# Patient Record
Sex: Female | Born: 1995 | Race: Black or African American | Hispanic: No | Marital: Married | Smoking: Never smoker
Health system: Southern US, Community
[De-identification: ages and names within clinical notes are randomized; demographics above are authoritative.]

## PROBLEM LIST (undated history)

## (undated) ENCOUNTER — Inpatient Hospital Stay (HOSPITAL_COMMUNITY): Payer: Self-pay

## (undated) DIAGNOSIS — F32A Depression, unspecified: Secondary | ICD-10-CM

## (undated) DIAGNOSIS — J45909 Unspecified asthma, uncomplicated: Secondary | ICD-10-CM

## (undated) DIAGNOSIS — K5909 Other constipation: Secondary | ICD-10-CM

## (undated) DIAGNOSIS — R109 Unspecified abdominal pain: Secondary | ICD-10-CM

## (undated) DIAGNOSIS — L309 Dermatitis, unspecified: Secondary | ICD-10-CM

## (undated) HISTORY — DX: Other constipation: K59.09

## (undated) HISTORY — DX: Unspecified abdominal pain: R10.9

---
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
## 2015-01-13 ENCOUNTER — Encounter: Payer: Self-pay | Admitting: Pediatrics

## 2015-01-13 ENCOUNTER — Ambulatory Visit (INDEPENDENT_AMBULATORY_CARE_PROVIDER_SITE_OTHER): Payer: Medicaid Other | Admitting: Pediatrics

## 2015-01-13 VITALS — BP 124/70 | Temp 97.8°F | Wt 161.4 lb

## 2015-01-13 DIAGNOSIS — J301 Allergic rhinitis due to pollen: Secondary | ICD-10-CM

## 2015-01-13 DIAGNOSIS — Z23 Encounter for immunization: Secondary | ICD-10-CM

## 2015-01-13 MED ORDER — FLUTICASONE PROPIONATE 50 MCG/ACT NA SUSP: NASAL | Status: DC

## 2015-01-13 NOTE — Progress Notes (Signed)
History was provided by the patient.  Tina Dickson is a 19 y.o. female who is here for cold like symptoms over the past 3 days.  Congested, cough, headache and feeling warmer. [facility] medicine for her throat but it hasn't been working, has been taking Robitussin at night which helps some.  Symptoms worse at night. No decrease PO intake, change in stools or voids.    The following portions of the patient's history were reviewed and updated as appropriate: allergies, current medications, past family history, past medical history, past social history, past surgical history and problem list.  ROS   Physical Exam:  BP 124/70 mmHg  Temp(Src) 97.8 F (36.6 C) (Temporal)  Wt 161 lb 6.4 oz (73.211 kg)  HR: 70   No height on file for this encounter. No LMP recorded.  General:   alert, cooperative, appears stated age and no distress     Skin:   normal  Oral cavity:   lips, mucosa, and tongue normal; teeth and gums normal, throat is erythematous and noted some post nasal dripping, tenderness over the maximal sinus   Eyes:   sclerae white  Ears:   normal bilaterally  Nose:  no nasal flaring, Nasal turbinates are +4 and boggy   Neck:  Neck appearance: Normal  Lungs:  clear to auscultation bilaterally  Heart:   regular rate and rhythm, S1, S2 normal, no murmur, click, rub or gallop   Abdomen:  soft, non-tender; bowel sounds normal; no masses,  no organomegaly  GU:  not examined  Extremities:   extremities normal, atraumatic, no cyanosis or edema  Neuro:  normal without focal findings    Assessment/Plan:  1. Need for vaccination - Flu Vaccine QUAD 36+ mos IM  2. Allergic rhinitis due to pollen - fluticasone (FLONASE) 50 MCG/ACT nasal spray; 2 sprays each nostril in each nostril in the morning and night.  Dispense: 16 g; Refill: 12     Tina Everly Griffith CitronNicole Deerica Waszak, MD  01/13/2015

## 2015-01-13 NOTE — Patient Instructions (Signed)
Can take Benadryl( Diphenhydramine) every 8 hours as needed for allergic rhinitis symptoms  Allergic Rhinitis Allergic rhinitis is when the mucous membranes in the nose respond to allergens. Allergens are particles in the air that cause your body to have an allergic reaction. This causes you to release allergic antibodies. Through a chain of events, these eventually cause you to release histamine into the blood stream. Although meant to protect the body, it is this release of histamine that causes your discomfort, such as frequent sneezing, congestion, and an itchy, runny nose.  CAUSES Seasonal allergic rhinitis (hay fever) is caused by pollen allergens that may come from grasses, trees, and weeds. Year-round allergic rhinitis (perennial allergic rhinitis) is caused by allergens such as house dust mites, pet dander, and mold spores. SYMPTOMS  Nasal stuffiness (congestion).  Itchy, runny nose with sneezing and tearing of the eyes. DIAGNOSIS Your health care provider can help you determine the allergen or allergens that trigger your symptoms. If you and your health care provider are unable to determine the allergen, skin or blood testing may be used. Your health care provider will diagnose your condition after taking your health history and performing a physical exam. Your health care provider may assess you for other related conditions, such as asthma, pink eye, or an ear infection. TREATMENT Allergic rhinitis does not have a cure, but it can be controlled by:  Medicines that block allergy symptoms. These may include allergy shots, nasal sprays, and oral antihistamines.  Avoiding the allergen. Hay fever may often be treated with antihistamines in pill or nasal spray forms. Antihistamines block the effects of histamine. There are over-the-counter medicines that may help with nasal congestion and swelling around the eyes. Check with your health care provider before taking or giving this medicine. If  avoiding the allergen or the medicine prescribed do not work, there are many new medicines your health care provider can prescribe. Stronger medicine may be used if initial measures are ineffective. Desensitizing injections can be used if medicine and avoidance does not work. Desensitization is when a patient is given ongoing shots until the body becomes less sensitive to the allergen. Make sure you follow up with your health care provider if problems continue. HOME CARE INSTRUCTIONS It is not possible to completely avoid allergens, but you can reduce your symptoms by taking steps to limit your exposure to them. It helps to know exactly what you are allergic to so that you can avoid your specific triggers. SEEK MEDICAL CARE IF:  You have a fever.  You develop a cough that does not stop easily (persistent).  You have shortness of breath.  You start wheezing.  Symptoms interfere with normal daily activities.   This information is not intended to replace advice given to you by your health care provider. Make sure you discuss any questions you have with your health care provider.   Document Released: 12/13/2000 Document Revised: 04/10/2014 Document Reviewed: 11/25/2012 Elsevier Interactive Patient Education [employer]2016 Elsevier Inc.

## 2015-01-22 ENCOUNTER — Encounter (HOSPITAL_COMMUNITY): Payer: Self-pay | Admitting: *Deleted

## 2015-01-22 ENCOUNTER — Emergency Department (HOSPITAL_COMMUNITY)
Admission: EM | Admit: 2015-01-22 | Discharge: 2015-01-22 | Disposition: A | Discharge status: Home / Self Care | Payer: Medicaid Other | Attending: Emergency Medicine | Admitting: Emergency Medicine

## 2015-01-22 DIAGNOSIS — Y998 Other external cause status: Secondary | ICD-10-CM | POA: Diagnosis not present

## 2015-01-22 DIAGNOSIS — Y9289 Other specified places as the place of occurrence of the external cause: Secondary | ICD-10-CM | POA: Diagnosis not present

## 2015-01-22 DIAGNOSIS — T63481A Toxic effect of venom of other arthropod, accidental (unintentional), initial encounter: Secondary | ICD-10-CM | POA: Insufficient documentation

## 2015-01-22 DIAGNOSIS — Z79899 Other long term (current) drug therapy: Secondary | ICD-10-CM | POA: Diagnosis not present

## 2015-01-22 DIAGNOSIS — J45909 Unspecified asthma, uncomplicated: Secondary | ICD-10-CM | POA: Insufficient documentation

## 2015-01-22 DIAGNOSIS — Y9389 Activity, other specified: Secondary | ICD-10-CM | POA: Diagnosis not present

## 2015-01-22 DIAGNOSIS — Z8719 Personal history of other diseases of the digestive system: Secondary | ICD-10-CM | POA: Insufficient documentation

## 2015-01-22 MED ORDER — HYDROCORTISONE 2.5 % EX LOTN: TOPICAL_LOTION | Freq: Two times a day (BID) | CUTANEOUS | Status: DC

## 2015-01-22 MED ORDER — CEPHALEXIN 500 MG PO CAPS
500.0000 mg | ORAL_CAPSULE | Freq: Once | ORAL | Status: AC
  Administered 2015-01-22: 500 mg via ORAL
  Filled 2015-01-22: qty 1

## 2015-01-22 MED ORDER — DIPHENHYDRAMINE HCL 25 MG PO CAPS
50.0000 mg | ORAL_CAPSULE | Freq: Once | ORAL | Status: AC
  Administered 2015-01-22: 50 mg via ORAL
  Filled 2015-01-22: qty 2

## 2015-01-22 MED ORDER — CEPHALEXIN 500 MG PO CAPS: 500.0000 mg | ORAL_CAPSULE | Freq: Three times a day (TID) | ORAL | Status: DC

## 2015-01-22 MED ORDER — HYDROXYZINE HCL 25 MG PO TABS: 25.0000 mg | ORAL_TABLET | Freq: Four times a day (QID) | ORAL | Status: DC | PRN

## 2015-01-22 MED ORDER — IBUPROFEN 800 MG PO TABS
800.0000 mg | ORAL_TABLET | Freq: Once | ORAL | Status: AC
  Administered 2015-01-22: 800 mg via ORAL
  Filled 2015-01-22: qty 1

## 2015-01-22 NOTE — ED Notes (Signed)
Pt states that she was walking to class and felt something bite her to her rt thigh; pt states that yesterday it was the size of a quarter and has progressively gotten bigger; pt has redness to lateral side of thigh; pt c/o itching and pain radiating down rt leg

## 2015-01-22 NOTE — ED Provider Notes (Signed)
CSN: [id number]     Arrival date & time 01/22/15  0112 History   First MD Initiated Contact with Patient 01/22/15 0232     Chief Complaint  Patient presents with  . Insect Bite     (Consider location/radiation/quality/duration/timing/severity/associated sxs/prior Treatment) HPI Comments: 19 year old female presents to the emergency department for further evaluation of an insect bite to her lateral right thigh. Patient states that the bite was sustained 2 days ago. She is unsure of what bit her. Patient reports having 2 areas the size of approximately a quarter which became larger and more erythematous over the last 2 hours. Patient states that the area is itchy. She has taken Benadryl without relief. She complains of some mild aching at the site of her symptoms. She has not taken any medications for pain. Patient denies any pus draining from the area as well as any red streaking up or down her leg. Patient has had no fever, lip swelling, tongue swelling, or SOB.  The history is provided by the patient. No language interpreter was used.    Past Medical History  Diagnosis Date  . Chronic constipation   . Abdominal pain, recurrent     characterized as crampy, spasmodic pain  . Asthma     as a child- not since 2010   Past Surgical History  Procedure Laterality Date  . Hernia repair  1998   Family History  Problem Relation Age of Onset  . Lactose intolerance Mother   . Asthma Brother   . Irritable bowel syndrome Neg Hx   . Diabetes Father   . Cancer Maternal Grandmother   . Diabetes Paternal Grandfather    Social History  Substance Use Topics  . Smoking status: Never Smoker   . Smokeless tobacco: Never Used  . Alcohol Use: No   OB History    No data available      Review of Systems  Constitutional: Negative for fever.  HENT: Negative for facial swelling.   Respiratory: Negative for shortness of breath.   Musculoskeletal: Positive for myalgias.  Skin: Positive for rash.   Neurological: Negative for weakness and numbness.  All other systems reviewed and are negative.   Allergies  Review of patient's allergies indicates no known allergies.  Home Medications   Prior to Admission medications   Medication Sig Start Date End Date Taking? Authorizing Provider  diphenhydrAMINE (BENADRYL) 25 mg capsule Take 25 mg by mouth every 6 (six) hours as needed for itching.   Yes Historical Provider, MD  ibuprofen (ADVIL,MOTRIN) 600 MG tablet Take 1 tablet (600 mg total) by mouth every 6 (six) hours as needed (for menstrual pain). 04/16/14  Yes Gregor Hams, NP  oxymetazoline (AFRIN) 0.05 % nasal spray Place 1 spray into both nostrils 2 (two) times daily.   Yes Historical Provider, MD  cephALEXin (KEFLEX) 500 MG capsule Take 1 capsule (500 mg total) by mouth 3 (three) times daily. 01/22/15   Antony Madura, PA-C  fluticasone (FLONASE) 50 MCG/ACT nasal spray 2 sprays each nostril in each nostril in the morning and night. Patient not taking: Reported on 01/22/2015 01/13/15   Cherece Griffith Citron, MD  hydrocortisone 2.5 % lotion Apply topically 2 (two) times daily. Apply to affected area to improve redness/itching 01/22/15   Antony Madura, PA-C  hydrOXYzine (ATARAX/VISTARIL) 25 MG tablet Take 1 tablet (25 mg total) by mouth every 6 (six) hours as needed for itching. 01/22/15   Antony Madura, PA-C  tretinoin (RETIN-A) 0.025 % cream Apply topically at  bedtime. For acne prevention Patient not taking: Reported on 04/16/2014 04/15/13   Burnard HawthorneMelinda C Paul, MD  Triamcinolone Acetonide (TRIAMCINOLONE 0.1 % CREAM : EUCERIN) CREA Apply 1 application topically 2 (two) times daily. Patient not taking: Reported on 01/22/2015 04/15/13   Burnard HawthorneMelinda C Paul, MD   BP 136/73 mmHg  Pulse 76  Temp(Src) 98.1 F (36.7 C) (Oral)  Resp 20  SpO2 100%  LMP 01/15/2015   Physical Exam  Constitutional: She is oriented to person, place, and time. She appears well-developed and well-nourished. No distress.   Nontoxic/nonseptic appearing  HENT:  Head: Normocephalic and atraumatic.  No angioedema. Patient tolerating secretions without difficulty.  Eyes: Conjunctivae and EOM are normal. No scleral icterus.  Neck: Normal range of motion.  Cardiovascular: Normal rate, regular rhythm and intact distal pulses.   DP and PT pulses 2+ in the RLE  Pulmonary/Chest: Effort normal. No respiratory distress.  Respirations even and unlabored  Musculoskeletal: Normal range of motion.  Neurological: She is alert and oriented to person, place, and time. She exhibits normal muscle tone. Coordination normal.  Skin: Skin is warm and dry. Rash noted. She is not diaphoretic. There is erythema. No pallor.  Large erythematous area to lateral R thigh, approximately 5cm x 15cm. Erythema is pruritic and blanching c/w wheal. No fluctuance or purulence. No red linear streaking.   Psychiatric: She has a normal mood and affect. Her behavior is normal.  Nursing note and vitals reviewed.   ED Course  Procedures (including critical care time) Labs Review Labs Reviewed - No data to display  Imaging Review No results found.   I have personally reviewed and evaluated these images and lab results as part of my medical decision-making.   EKG Interpretation None      MDM   Final diagnoses:  Local reaction to insect sting, accidental or unintentional, initial encounter    19 year old female presents to the emergency department for further evaluation of an insect bite. Symptoms most consistent with a local reaction to an insect sting; however, unable to rule out developing cellulitis given worsening redness over the last few hours. Patient is afebrile and hemodynamically stable. She is neurovascularly intact. No concern for abscess at this time. We'll treat symptomatically with antihistamines as well as Keflex. Return precautions discussed and provided. Patient agreeable to plan with no unaddressed concerns. Patient  discharged in good condition.   Filed Vitals:   01/22/15 0115 01/22/15 0307  BP: 133/77 136/73  Pulse: 62 76  Temp: 98.1 F (36.7 C)   TempSrc: Oral   Resp: 20   SpO2: 96% 100%     Antony MaduraKelly Phillip Maffei, PA-C 01/22/15 0327  Loren Raceravid Yelverton, MD 01/23/15 [phone]

## 2015-01-22 NOTE — Discharge Instructions (Signed)
You are being started on Keflex to prevent any worsening cellulitis that may have occurred as a result of your insect bite. Take Atarax as prescribed for itching. Use hydrocortisone topically for itching and redness. You may take Benadryl as needed for persistent itching, though this may make you drowsy. Take ibuprofen for discomfort/pain. Return to the Emergency Department if you develop fever or red lines streaking up or down your leg, or if you experience any of the symptoms listed below.  Insect Bite Mosquitoes, flies, fleas, bedbugs, and many other insects can bite. Insect bites are different from insect stings. A sting is when poison (venom) is injected into the skin. Insect bites can cause pain or itching for a few days, but they are usually not serious. Some insects can spread diseases to people through a bite. SYMPTOMS  Symptoms of an insect bite include:  Itching or pain in the bite area.  Redness and swelling in the bite area.  An open wound (skin ulcer). In many cases, symptoms last for 2-4 days.  DIAGNOSIS  This condition is usually diagnosed based on symptoms and a physical exam. TREATMENT  Treatment is usually not needed for an insect bite. Symptoms often go away on their own. Your health care provider may recommend creams or lotions to help reduce itching. Antibiotic medicines may be prescribed if the bite becomes infected. A tetanus shot may be given in some cases. If you develop an allergic reaction to an insect bite, your health care provider will prescribe medicines to treat the reaction (antihistamines). This is rare. HOME CARE INSTRUCTIONS  Do not scratch the bite area.  Keep the bite area clean and dry. Wash the bite area daily with soap and water as told by your health care provider.  If directed, applyice to the bite area.  Put ice in a plastic bag.  Place a towel between your skin and the bag.  Leave the ice on for 20 minutes, 2-3 times per day.  To help  reduce itching and swelling, try applying a baking soda paste, cortisone cream, or calamine lotion to the bite area as told by your health care provider.  Apply or take over-the-counter and prescription medicines only as told by your health care provider.  If you were prescribed an antibiotic medicine, use it as told by your health care provider. Do not stop using the antibiotic even if your condition improves.  Keep all follow-up visits as told by your health care provider. This is important. PREVENTION   Use insect repellent. The best insect repellents contain:  DEET, picaridin, oil of lemon eucalyptus (OLE), or IR3535.  Higher amounts of an active ingredient.  When you are outdoors, wear clothing that covers your arms and legs.  Avoid opening windows that do not have window screens. SEEK MEDICAL CARE IF:  You have increased redness, swelling, or pain in the bite area.  You have a fever. SEEK IMMEDIATE MEDICAL CARE IF:   You have joint pain.   You have fluid, blood, or pus coming from the bite area.  You have a headache or neck pain.  You have unusual weakness.  You have a rash.  You have chest pain or shortness of breath.  You have abdominal pain, nausea, or vomiting.  You feel unusually tired or sleepy.   This information is not intended to replace advice given to you by your health care provider. Make sure you discuss any questions you have with your health care provider.   Document  Released: 04/27/2004 Document Revised: 12/09/2014 Document Reviewed: 08/05/2014 Elsevier Interactive Patient Education 2016 Elsevier Inc. Cellulitis Cellulitis is an infection of the skin and the tissue under the skin. The infected area is usually red and tender. This happens most often in the arms and lower legs. HOME CARE   Take your antibiotic medicine as told. Finish the medicine even if you start to feel better.  Keep the infected arm or leg raised (elevated).  Put a warm  cloth on the area up to 4 times per day.  Only take medicines as told by your doctor.  Keep all doctor visits as told. GET HELP IF:  You see red streaks on the skin coming from the infected area.  Your red area gets bigger or turns a dark color.  Your bone or joint under the infected area is painful after the skin heals.  Your infection comes back in the same area or different area.  You have a puffy (swollen) bump in the infected area.  You have new symptoms.  You have a fever. GET HELP RIGHT AWAY IF:   You feel very sleepy.  You throw up (vomit) or have watery poop (diarrhea).  You feel sick and have muscle aches and pains.   This information is not intended to replace advice given to you by your health care provider. Make sure you discuss any questions you have with your health care provider.   Document Released: 09/06/2007 Document Revised: 12/09/2014 Document Reviewed: 06/05/2011 Elsevier Interactive Patient Education [employer] Elsevier Inc.

## 2015-03-02 ENCOUNTER — Ambulatory Visit (INDEPENDENT_AMBULATORY_CARE_PROVIDER_SITE_OTHER): Payer: Medicaid Other | Admitting: Pediatrics

## 2015-03-02 ENCOUNTER — Encounter: Payer: Self-pay | Admitting: Pediatrics

## 2015-03-02 VITALS — Temp 97.9°F | Wt 166.2 lb

## 2015-03-02 DIAGNOSIS — H109 Unspecified conjunctivitis: Secondary | ICD-10-CM | POA: Diagnosis not present

## 2015-03-02 MED ORDER — POLYMYXIN B-TRIMETHOPRIM 10000-0.1 UNIT/ML-% OP SOLN: 1.0000 [drp] | Freq: Three times a day (TID) | OPHTHALMIC | Status: DC

## 2015-03-02 NOTE — Patient Instructions (Signed)
Tina Dickson was seen in clinic today for concern for a red eye-- it is unclear what exactly is causing it, whether she has a virus, bacteria, or a small abrasion that will heal on it's own. We are sending in some antibiotic drops to your pharmacy-- place two drops in her right eye 2-3 times a day for 3-5 days. If it is not improving by the end of the week, come back to clinic so we can continue to monitor. If you have vision changes, severe worsening of pain or discomfort, or any new or worsening symptoms- please call or come back sooner.

## 2015-03-02 NOTE — Progress Notes (Signed)
CC: right eye redness  ASSESSMENT AND PLAN: Tina Dickson is a 19 y.o. female who comes to the clinic for unilateral eye redness/itchiness. Unclear etiology- differential is bacterial vs viral conjunctivitis vs a corneal abrasion vs allergic conjunctivitis. It is difficult to definitively diagnose due to the unilaterality of it, and overall lack of drainage. No evidence of ulcer or abrasion. Vision was assessed- noted to have some deficits, but she did not have her glasses with her, and she did not note any changes from her baseline.   Will treat for presumed bacterial conjunctivitis with poly-trim drops. Plan to see in clinic on Friday if not improving. Counseled on return precautions including severe pain, vision changes or any new symptoms.  Also counseled about discontinuing Afrin use due to possibility of rebound rhinorrea  - polytrim eye drops TID  Return to clinic Friday if symptoms do not improve  SUBJECTIVE Tina Dickson is a 19 y.o. female who comes to the clinic for one day history of red eye. She woke up this morning and her right eye was red. She had a small amount of drainage, and has been playing with it. Described as itchy- not painful. No pain with eye movement, no vision changes, no photosensitivity She wears glasses for distance, but no contacts. Doesn't remember having anything (rocks, eye lash, etc... ) in it.   Has also had a several day history of congestion, sinus pressure, cough. Is taking Afrin for it. Also has a history of allergies- not taking any meds currently.   PMH, Meds, Allergies, Social Hx and pertinent family hx reviewed and updated Past Medical History  Diagnosis Date  . Chronic constipation   . Abdominal pain, recurrent     characterized as crampy, spasmodic pain  . Asthma     as a child- not since 2010    Current outpatient prescriptions:  .  ibuprofen (ADVIL,MOTRIN) 600 MG tablet, Take 1 tablet (600 mg total) by mouth every 6 (six) hours as  needed (for menstrual pain)., Disp: 30 tablet, Rfl: 11 .  fluticasone (FLONASE) 50 MCG/ACT nasal spray, 2 sprays each nostril in each nostril in the morning and night. (Patient not taking: Reported on 01/22/2015), Disp: 16 g, Rfl: 12 .  hydrocortisone 2.5 % lotion, Apply topically 2 (two) times daily. Apply to affected area to improve redness/itching (Patient not taking: Reported on 03/02/2015), Disp: 59 mL, Rfl: 0 .  hydrOXYzine (ATARAX/VISTARIL) 25 MG tablet, Take 1 tablet (25 mg total) by mouth every 6 (six) hours as needed for itching. (Patient not taking: Reported on 03/02/2015), Disp: 12 tablet, Rfl: 0   OBJECTIVE Physical Exam Filed Vitals:   03/02/15 1515  Temp: 97.9 F (36.6 C)  TempSrc: Temporal  Weight: 166 lb 3.2 oz (75.388 kg)   Physical exam:  GEN: Awake, alert in no acute distress, pleasant 19 yo F HEENT: Normocephalic, atraumatic. PERRL. Right eye injected, small amount of drainage at the inner aspect, no pain with light, no pain with eye movement. TM normal bilaterally. Moist mucus membranes. Oropharynx normal with no erythema or exudate. Neck supple. No cervical lymphadenopathy. Mild maxillary sinus tenderness with palpation.   CV: Regular rate and rhythm. No murmurs, rubs or gallops. Normal radial pulses and capillary refill. RESP: Normal work of breathing. Lungs clear to auscultation bilaterally with no wheezes, rales or crackles.  GI: Normal bowel sounds. Abdomen soft, non-tender, non-distended with no hepatosplenomegaly or masses.  SKIN: mild acne on face, no rashes or lesions NEURO: Alert, moves  all extremities normally.   Donetta Potts, MD [facility]

## 2015-03-03 NOTE — Progress Notes (Signed)
I saw and evaluated the patient, performing the key elements of the service. I developed the management plan that is described in the resident's note, and I agree with the content.   Orie RoutAKINTEMI, Sanel Stemmer-KUNLE B                  03/03/2015, 11:59 AM

## 2015-06-02 ENCOUNTER — Other Ambulatory Visit: Payer: Self-pay | Admitting: Pediatrics

## 2015-06-02 DIAGNOSIS — N946 Dysmenorrhea, unspecified: Secondary | ICD-10-CM

## 2015-06-02 MED ORDER — IBUPROFEN 600 MG PO TABS: 600.0000 mg | ORAL_TABLET | Freq: Four times a day (QID) | ORAL | Status: DC | PRN

## 2015-07-03 ENCOUNTER — Inpatient Hospital Stay (HOSPITAL_COMMUNITY)
Admission: AD | Admit: 2015-07-03 | Discharge: 2015-07-03 | Disposition: A | Discharge status: Home / Self Care | Payer: Medicaid Other | Source: Ambulatory Visit | Attending: Obstetrics & Gynecology | Admitting: Obstetrics & Gynecology

## 2015-07-03 ENCOUNTER — Encounter (HOSPITAL_COMMUNITY): Payer: Self-pay

## 2015-07-03 DIAGNOSIS — K5909 Other constipation: Secondary | ICD-10-CM | POA: Diagnosis not present

## 2015-07-03 DIAGNOSIS — N926 Irregular menstruation, unspecified: Secondary | ICD-10-CM | POA: Diagnosis not present

## 2015-07-03 DIAGNOSIS — Z3202 Encounter for pregnancy test, result negative: Secondary | ICD-10-CM

## 2015-07-03 DIAGNOSIS — N912 Amenorrhea, unspecified: Secondary | ICD-10-CM | POA: Diagnosis not present

## 2015-07-03 LAB — URINALYSIS, ROUTINE W REFLEX MICROSCOPIC
Bilirubin Urine: NEGATIVE
GLUCOSE, UA: NEGATIVE mg/dL
Ketones, ur: NEGATIVE mg/dL
LEUKOCYTES UA: NEGATIVE
NITRITE: NEGATIVE
PH: 5.5 (ref 5.0–8.0)
Protein, ur: NEGATIVE mg/dL

## 2015-07-03 LAB — URINE MICROSCOPIC-ADD ON
BACTERIA UA: NONE SEEN
RBC / HPF: NONE SEEN RBC/hpf (ref 0–5)
WBC UA: NONE SEEN WBC/hpf (ref 0–5)

## 2015-07-03 LAB — POCT PREGNANCY, URINE: Preg Test, Ur: NEGATIVE

## 2015-07-03 NOTE — Discharge Instructions (Signed)
Preparing for Pregnancy Before trying to become pregnant, make an appointment with your health care provider (preconception care). The goal is to help you have a healthy, safe pregnancy. At your first appointment, your health care provider will:   Do a complete physical exam, including a Pap test.  Take a complete medical history.  Give you advice and help you resolve any problems. PRECONCEPTION CHECKLIST Here is a list of the basics to cover with your health care provider at your preconception visit:  Medical history.  Tell your health care provider about any diseases you have had. Many diseases can affect your pregnancy.  Include your partner's medical history and family history.  Make sure you have been tested for sexually transmitted infections (STIs). These can affect your pregnancy. In some cases, they can be passed to your baby. Tell your health care provider about any history of STIs.  Make sure your health care provider knows about any previous problems you have had with conception or pregnancy.  Tell your health care provider about any medicine you take. This includes herbal supplements and over-the-counter medicines.  Make sure all your immunizations are up to date. You may need to make additional appointments.  Ask your health care provider if you need any vaccinations or if there are any you should avoid.  Diet.  It is especially important to eat a healthy, balanced diet with the right nutrients when you are pregnant.  Ask your health care provider to help you get to a healthy weight before pregnancy.  If you are overweight, you are at higher risk for certain complications. These include high blood pressure, diabetes, and preterm birth.  If you are underweight, you are more likely to have a low-birth-weight baby.  Lifestyle.  Tell your health care provider about lifestyle factors such as alcohol use, drug use, or smoking.  Describe any harmful substances you may  be exposed to at work or home. These can include chemicals, pesticides, and radiation.  Mental health.  Let your health care provider know if you have been feeling depressed or anxious.  Let your health care provider know if you have a history of substance abuse.  Let your health care provider know if you do not feel safe at home. HOME INSTRUCTIONS TO PREPARE FOR PREGNANCY Follow your health care provider's advice and instructions.   Keep an accurate record of your menstrual periods. This makes it easier for your health care provider to determine your baby's due date.  Begin taking prenatal vitamins and folic acid supplements daily. Take them as directed by your health care provider.  Eat a balanced diet. Get help from a nutrition counselor if you have questions or need help.  Get regular exercise. Try to be active for at least 30 minutes a day most days of the week.  Quit smoking, if you smoke.  Do not drink alcohol.  Do not take illegal drugs.  Get medical problems, such as diabetes or high blood pressure, under control.  If you have diabetes, make sure you do the following:  Have good blood sugar control. If you have type 1 diabetes, use multiple daily doses of insulin. Do not use split-dose or premixed insulin.  Have an eye exam by a qualified eye care professional trained in caring for people with diabetes.  Get evaluated by your health care provider for cardiovascular disease.  Get to a healthy weight. If you are overweight or obese, reduce your weight with the help of a qualified health   professional such as a registered dietitian. Ask your health care provider what the right weight range is for you. HOW DO I KNOW I AM PREGNANT? You may be pregnant if you have been sexually active and you miss your period. Symptoms of early pregnancy include:   Mild cramping.  Very light vaginal bleeding (spotting).  Feeling unusually tired.  Morning sickness. If you have any of  these symptoms, take a home pregnancy test. These tests look for a hormone called human chorionic gonadotropin (hCG) in your urine. Your body begins to make this hormone during early pregnancy. These tests are very accurate. Wait until at least the first day you miss your period to take one. If you get a positive result, call your health care provider to make appointments for prenatal care. WHAT SHOULD I DO IF I BECOME PREGNANT?  Make an appointment with your health care provider by week 12 of your pregnancy at the latest.  Do not smoke. Smoking can be harmful to your baby.  Do not drink alcoholic beverages. Alcohol is related to a number of birth defects.  Avoid toxic odors and chemicals.  You may continue to have sexual intercourse if it does not cause pain or other problems, such as vaginal bleeding.   This information is not intended to replace advice given to you by your health care provider. Make sure you discuss any questions you have with your health care provider.   Document Released: 03/02/2008 Document Revised: 04/10/2014 Document Reviewed: 02/24/2013 Elsevier Interactive Patient Education 2016 Elsevier Inc.  

## 2015-07-03 NOTE — MAU Provider Note (Signed)
History     CSN: [id number]  Arrival date and time: 07/03/15 1011   First Provider Initiated Contact with Patient 07/03/15 1052      Chief Complaint  Patient presents with  . Menstrual Problem  . Vaginal Bleeding   HPI   Ms.Tina Dickson is a 20 y.o. female G0P0000 presenting to MAU with concerns about pregnancy and abnormal menstrual cycles. She had a period in February and it was normal, she missed her period in march and that concerned her.   Desires pregnancy Declines birth control at this time. She is not using any form of hormones.   OB History    Gravida Para Term Preterm AB TAB SAB Ectopic Multiple Living   0 0 0 0 0 0 0 0 0 0       Past Medical History  Diagnosis Date  . Chronic constipation   . Abdominal pain, recurrent     characterized as crampy, spasmodic pain  . Asthma     as a child- not since 2010    Past Surgical History  Procedure Laterality Date  . Hernia repair  1998    Family History  Problem Relation Age of Onset  . Lactose intolerance Mother   . Asthma Brother   . Irritable bowel syndrome Neg Hx   . Diabetes Father   . Cancer Maternal Grandmother   . Diabetes Paternal Grandfather     Social History  Substance Use Topics  . Smoking status: Never Smoker   . Smokeless tobacco: Never Used  . Alcohol Use: No    Allergies: No Known Allergies  Prescriptions prior to admission  Medication Sig Dispense Refill Last Dose  . fluticasone (FLONASE) 50 MCG/ACT nasal spray 2 sprays each nostril in each nostril in the morning and night. (Patient not taking: Reported on 01/22/2015) 16 g 12 Not Taking  . hydrocortisone 2.5 % lotion Apply topically 2 (two) times daily. Apply to affected area to improve redness/itching (Patient not taking: Reported on 03/02/2015) 59 mL 0 Not Taking  . hydrOXYzine (ATARAX/VISTARIL) 25 MG tablet Take 1 tablet (25 mg total) by mouth every 6 (six) hours as needed for itching. (Patient not taking: Reported on 03/02/2015)  12 tablet 0 Not Taking  . ibuprofen (ADVIL,MOTRIN) 600 MG tablet Take 1 tablet (600 mg total) by mouth every 6 (six) hours as needed (for menstrual pain). 30 tablet 11   . trimethoprim-polymyxin b (POLYTRIM) ophthalmic solution Place 1 drop into the right eye 3 (three) times daily. 10 mL 0    Results for orders placed or performed during the hospital encounter of 07/03/15 (from the past 48 hour(s))  Urinalysis, Routine w reflex microscopic (not at [facility])     Status: Abnormal   Collection Time: 07/03/15 10:17 AM  Result Value Ref Range   Color, Urine YELLOW YELLOW   APPearance CLEAR CLEAR   Specific Gravity, Urine <1.005 (L) 1.005 - 1.030   pH 5.5 5.0 - 8.0   Glucose, UA NEGATIVE NEGATIVE mg/dL   Hgb urine dipstick TRACE (A) NEGATIVE   Bilirubin Urine NEGATIVE NEGATIVE   Ketones, ur NEGATIVE NEGATIVE mg/dL   Protein, ur NEGATIVE NEGATIVE mg/dL   Nitrite NEGATIVE NEGATIVE   Leukocytes, UA NEGATIVE NEGATIVE  Urine microscopic-add on     Status: Abnormal   Collection Time: 07/03/15 10:17 AM  Result Value Ref Range   Squamous Epithelial / LPF 0-5 (A) NONE SEEN   WBC, UA NONE SEEN 0 - 5 WBC/hpf   RBC / HPF  NONE SEEN 0 - 5 RBC/hpf   Bacteria, UA NONE SEEN NONE SEEN  Pregnancy, urine POC     Status: None   Collection Time: 07/03/15 10:23 AM  Result Value Ref Range   Preg Test, Ur NEGATIVE NEGATIVE    Comment:        THE SENSITIVITY OF THIS METHODOLOGY IS >24 mIU/mL     Review of Systems  Constitutional: Negative for fever and chills.  Gastrointestinal: Negative for nausea, vomiting and abdominal pain.  Genitourinary: Negative for dysuria.  Neurological: Negative for dizziness.   Physical Exam   Blood pressure 152/85, pulse 83, temperature 98.4 F (36.9 C), resp. rate 18, height  (1.626 m), weight 166 lb (75.297 kg), last menstrual period 05/28/2015.  Physical Exam  Constitutional: She is oriented to person, place, and time. She appears well-developed and well-nourished. No  distress.  HENT:  Head: Normocephalic.  Musculoskeletal: Normal range of motion.  Neurological: She is alert and oriented to person, place, and time.  Skin: Skin is warm. She is not diaphoretic.  Psychiatric: Her behavior is normal.    MAU Course  Procedures None  MDM Patient declines pelvic exam or vaginal cultures.   Assessment and Plan   A:  1. Missed period   2. Encounter for pregnancy test with result negative     P:  Discharge home in stable condition Return to MAU for emergencies Patient given the [facility] contact info and told to call if wanting to establish GYN care At home pregnancy tests encouraged.   Duane Lope, NP 07/03/2015 11:42 AM

## 2015-07-03 NOTE — MAU Note (Addendum)
LMP 05/28/15 missed period in March had spotting two days this past week, denies cramping. Has history of irregular periods.

## 2015-07-05 ENCOUNTER — Emergency Department (INDEPENDENT_AMBULATORY_CARE_PROVIDER_SITE_OTHER)
Admission: EM | Admit: 2015-07-05 | Discharge: 2015-07-05 | Disposition: A | Discharge status: Home / Self Care | Payer: Medicaid Other | Source: Home / Self Care | Attending: Family Medicine | Admitting: Family Medicine

## 2015-07-05 ENCOUNTER — Ambulatory Visit: Payer: Medicaid Other | Admitting: Pediatrics

## 2015-07-05 ENCOUNTER — Encounter (HOSPITAL_COMMUNITY): Payer: Self-pay | Admitting: Emergency Medicine

## 2015-07-05 ENCOUNTER — Encounter: Payer: Self-pay | Admitting: Pediatrics

## 2015-07-05 DIAGNOSIS — Z3200 Encounter for pregnancy test, result unknown: Secondary | ICD-10-CM

## 2015-07-05 LAB — HCG, SERUM, QUALITATIVE: Preg, Serum: NEGATIVE

## 2015-07-05 NOTE — ED Notes (Signed)
Patient c/o possible pregnancy. She reports she has not had a menstrual cycle since Feb. She has had a urine pregnancy which was negative. She would like a blood pregnancy test. Patient is in NAD.

## 2015-07-05 NOTE — ED Provider Notes (Signed)
CSN: [id number]     Arrival date & time 07/05/15  1438 History   First MD Initiated Contact with Patient 07/05/15 1652     No chief complaint on file.  (Consider location/radiation/quality/duration/timing/severity/associated sxs/prior Treatment) HPI Pt is here for serum HCG. She was seen at the ER and had an neg urine, and a neg home test, but wants confirmation by blood.  States that her breasts are different and a bit tender.  No birth control Past Medical History  Diagnosis Date  . Chronic constipation   . Abdominal pain, recurrent     characterized as crampy, spasmodic pain  . Asthma     as a child- not since 2010   Past Surgical History  Procedure Laterality Date  . Hernia repair  1998   Family History  Problem Relation Age of Onset  . Lactose intolerance Mother   . Asthma Brother   . Irritable bowel syndrome Neg Hx   . Diabetes Father   . Cancer Maternal Grandmother   . Diabetes Paternal Grandfather    Social History  Substance Use Topics  . Smoking status: Never Smoker   . Smokeless tobacco: Never Used  . Alcohol Use: No   OB History    Gravida Para Term Preterm AB TAB SAB Ectopic Multiple Living   0 0 0 0 0 0 0 0 0 0      Review of Systems Pregnancy test Allergies  Review of patient's allergies indicates no known allergies.  Home Medications   Prior to Admission medications   Medication Sig Start Date End Date Taking? Authorizing Provider  fluticasone (FLONASE) 50 MCG/ACT nasal spray 2 sprays each nostril in each nostril in the morning and night. Patient not taking: Reported on 01/22/2015 01/13/15   Cherece Griffith CitronNicole Grier, MD  hydrocortisone 2.5 % lotion Apply topically 2 (two) times daily. Apply to affected area to improve redness/itching Patient not taking: Reported on 03/02/2015 01/22/15   Antony MaduraKelly Humes, PA-C  hydrOXYzine (ATARAX/VISTARIL) 25 MG tablet Take 1 tablet (25 mg total) by mouth every 6 (six) hours as needed for itching. Patient not taking:  Reported on 03/02/2015 01/22/15   Antony MaduraKelly Humes, PA-C  ibuprofen (ADVIL,MOTRIN) 600 MG tablet Take 1 tablet (600 mg total) by mouth every 6 (six) hours as needed (for menstrual pain). 06/02/15   Gregor HamsJacqueline Tebben, NP  trimethoprim-polymyxin b (POLYTRIM) ophthalmic solution Place 1 drop into the right eye 3 (three) times daily. 03/02/15   Armanda HeritageSara C Sanders, MD   Meds Ordered and Administered this Visit  Medications - No data to display  LMP 05/28/2015 No data found.   Physical Exam NURSES NOTES AND VITAL SIGNS REVIEWED. CONSTITUTIONAL: Well developed, well nourished, no acute distress HEENT: normocephalic, atraumatic EYES: Conjunctiva normal NECK:normal ROM, supple, no adenopathy PULMONARY:No respiratory distress, normal effort MUSCULOSKELETAL: Normal ROM of all extremities,  SKIN: warm and dry without rash PSYCHIATRIC: Mood and affect, behavior are normal  ED Course  Procedures (including critical care time)  Labs Review Labs Reviewed  HCG, SERUM, QUALITATIVE    Imaging Review No results found.   Visual Acuity Review  Right Eye Distance:   Left Eye Distance:   Bilateral Distance:    Right Eye Near:   Left Eye Near:    Bilateral Near:       Serum HCG is negative Pt will be contacted at home with tests results.  Attempted call, message to call back.   MDM   1. Encounter for pregnancy test     Patient  is reassured that there are no issues that require transfer to higher level of care at this time or additional tests. Patient is advised to continue home symptomatic treatment. Patient is advised that if there are new or worsening symptoms to attend the emergency department, contact primary care provider, or return to UC. Instructions of care provided discharged home in stable condition.    THIS NOTE WAS GENERATED USING A VOICE RECOGNITION SOFTWARE PROGRAM. ALL REASONABLE EFFORTS  WERE MADE TO PROOFREAD THIS DOCUMENT FOR ACCURACY.  I have verbally reviewed the  discharge instructions with the patient. A printed AVS was given to the patient.  All questions were answered prior to discharge.      Tharon Aquas, PA 07/05/15 [place]

## 2015-07-05 NOTE — Discharge Instructions (Signed)
Pregnancy Test Information WHAT IS A PREGNANCY TEST? A pregnancy test is used to detect the presence of human chorionic gonadotropin (hCG) in a sample of your urine or blood. hCG is a hormone produced by the cells of the placenta. The placenta is the organ that forms to nourish and support a developing baby. This test requires a sample of either blood or urine. A pregnancy test determines whether you are pregnant or not. HOW ARE PREGNANCY TESTS DONE? Pregnancy tests are done using a home pregnancy test or having a blood or urine test done at your health care provider's office.  Home pregnancy tests require a urine sample.  Most kits use a plastic testing device with a strip of paper that indicates whether there is hCG in your urine.  Follow the test instructions very carefully.  After you urinate on the test stick, markings will appear to let you know whether you are pregnant.  For best results, use your first urine of the morning. That is when the concentration of hCG is highest. Having a blood test to check for pregnancy requires a sample of blood drawn from a vein in your hand or arm. Your health care provider will send your sample to a lab for testing. Results of a pregnancy test will be positive or negative. IS ONE TYPE OF PREGNANCY TEST BETTER THAN ANOTHER? In some cases, a blood test will return a positive result even if a urine test was negative because blood tests are more sensitive. This means blood tests can detect hCG earlier than home pregnancy tests.  HOW ACCURATE ARE HOME PREGNANCY TESTS?  Both types of pregnancy tests are very accurate.  A blood test is about 98% accurate.  When you are far enough along in your pregnancy and when used correctly, home pregnancy tests are equally accurate. CAN ANYTHING INTERFERE WITH HOME PREGNANCY TEST RESULTS?  It is possible for certain conditions to cause an inaccurate test result (false positive or falsenegative).  A false positive is a  positive test result when you are not pregnant. This can happen if you:  Are taking certain medicines, including anticonvulsants or tranquilizers.  Have certain proteins in your blood.  A false negative is a negative test result when you are pregnant. This can happen if you:  Took the test before there was enough hCG to detect. A pregnancy test will not be positive in most women until 3-4 weeks after conception.  Drank a lot of liquid before the test. Diluted urine samples can sometimes give an inaccurate result.  Take certain medicines, such as water pills (diuretics) or some antihistamines. WHAT SHOULD I DO IF I HAVE A POSITIVE PREGNANCY TEST? If you have a positive pregnancy test, schedule an appointment with your health care provider. You might need additional testing to confirm the pregnancy. In the meantime, begin taking a prenatal vitamin, stop smoking, stop drinking alcohol, and do not use street drugs. Talk to your health care provider about how to take care of yourself during your pregnancy. Ask about what to expect from the care you will need throughout pregnancy (prenatal care).   This information is not intended to replace advice given to you by your health care provider. Make sure you discuss any questions you have with your health care provider.   Document Released: 03/23/2003 Document Revised: 04/10/2014 Document Reviewed: 07/15/2013 Elsevier Interactive Patient Education 2016 Elsevier Inc.  Human Chorionic Gonadotropin Test Human chorionic gonadotropin (hCG) is a hormone produced during pregnancy by the cells  that form the placenta. The placenta is the organ that grows inside your womb (uterus) to nourish a developing baby. When you are pregnant, hCG starts to appear in your blood about 11 days after conception. It continues to go up for the first 8-11 weeks of pregnancy.  Your hCG level can be measured with several different types of tests. You may have:  A urine  test.  hCG is eliminated from your body by your kidneys, so a urine test is one way to check for this hormone.  A urine test only shows whether there is hCG in your urine. It does not measure how much.  You may have a urine test to find out whether you are pregnant.  A home pregnancy test detects whether there is hCG in your urine.  A qualitative blood test.  Like the urine test, this blood test only shows whether there is hCG in your blood. It does not measure how much.  You may have this type of blood test to find out whether you are pregnant.  A quantitative blood test.  This type of blood test measures the amount of hCG in your blood.  You may have this type of test to diagnose an abnormal pregnancy or determine whether you are at risk of, or have had, a failed pregnancy (miscarriage). PREPARATION FOR TEST For the urine test:  Limit your fluid intake before the urine test as directed by your health care provider.  Collect the sample the first time you urinate in the morning.  Let your health care provider know if you have blood in your urine. This may interfere with the test result. Some medicines may interfere with the urine and blood tests. Let your health care provider know about all the medicines you are taking. No additional preparation is required for the blood test.  RESULTS It is your responsibility to obtain your test results. Ask the lab or department performing the test when and how you will get your results. Talk to your health care provider if you have any questions about your test results. The results of the hCG urine test and the qualitative hCG blood test are either positive or negative. The results of the quantitative hCG blood test are reported as a number. hCG is measured in international units per liter (IU/L). Meaning of Negative Test Results A negative result on a urine or qualitative blood test could mean that you are not pregnant. It could also mean the  test was done too early to detect hCG. If you still have other signs of pregnancy, the test should be repeated. Meaning of Positive Test Results A positive result on the urine or qualitative blood tests means you are most likely pregnant. Your health care provider may confirm your pregnancy with an imaging study of the inside of your uterus at 5-6 weeks (ultrasound).  Range of Normal Values Ranges for normal values for the quantitative hCG blood test may vary among different labs and hospitals. You should always check with your health care provider after having lab work or other tests done to discuss whether your values are considered within normal limits.   Less than 5 IU/L means it is most likely you are not pregnant.  Greater than 25 IU/L means it is most likely you are pregnant. Meaning of Results Outside Normal Value Ranges If your hCG level on the quantitative test is not what would be expected, you may have the test again. It may also be important for  your health care provider to know whether your hCG level goes up or down over time. Common causes of results outside the normal range include:   Being pregnant with twins (hCG level is higher than expected).  Having an ectopic pregnancy (hCG rises more slowly than expected).  Miscarriage (hCG level falls).  Abnormal growths in the womb (hCG level is higher than expected).   This information is not intended to replace advice given to you by your health care provider. Make sure you discuss any questions you have with your health care provider.   Document Released: 04/21/2004 Document Revised: 04/10/2014 Document Reviewed: 06/24/2013 Elsevier Interactive Patient Education [employer]

## 2016-04-03 NOTE — L&D Delivery Note (Addendum)
Delivery Note Pt labored to complete with urge to push. She pushed well and at 8:44 PM a viable female was delivered via  (Presentation:ROT ;  ).  APGAR: 8,9 , ; weight  pending .   Placenta status: delivered, intact, .  Cord: nucal x 1 3vc  Anesthesia:  Epidural Episiotomy:  none Lacerations:  B/l labial and first degree perineal  Suture Repair: 3.0 vicryl Est. Blood Loss (mL):  350-426ml  Mom to postpartum.  Baby to Couplet care / Skin to Skin.  Delivered by Dr Hinton Rao as I was in the OR  Cathrine Muster 12/28/2016, 9:05 PM

## 2016-05-08 ENCOUNTER — Inpatient Hospital Stay (HOSPITAL_COMMUNITY): Payer: BLUE CROSS/BLUE SHIELD

## 2016-05-08 ENCOUNTER — Encounter (HOSPITAL_COMMUNITY): Payer: Self-pay | Admitting: *Deleted

## 2016-05-08 ENCOUNTER — Inpatient Hospital Stay (HOSPITAL_COMMUNITY)
Admission: AD | Admit: 2016-05-08 | Discharge: 2016-05-08 | Disposition: A | Discharge status: Home / Self Care | Payer: BLUE CROSS/BLUE SHIELD | Source: Ambulatory Visit | Attending: Obstetrics and Gynecology | Admitting: Obstetrics and Gynecology

## 2016-05-08 DIAGNOSIS — O209 Hemorrhage in early pregnancy, unspecified: Secondary | ICD-10-CM | POA: Insufficient documentation

## 2016-05-08 DIAGNOSIS — Z3A01 Less than 8 weeks gestation of pregnancy: Secondary | ICD-10-CM | POA: Diagnosis not present

## 2016-05-08 LAB — CBC
HEMATOCRIT: 37.4 % (ref 36.0–46.0)
HEMOGLOBIN: 13 g/dL (ref 12.0–15.0)
MCH: 27.4 pg (ref 26.0–34.0)
MCHC: 34.8 g/dL (ref 30.0–36.0)
MCV: 78.9 fL (ref 78.0–100.0)
Platelets: 224 10*3/uL (ref 150–400)
RBC: 4.74 MIL/uL (ref 3.87–5.11)
RDW: 13.4 % (ref 11.5–15.5)
WBC: 5.4 10*3/uL (ref 4.0–10.5)

## 2016-05-08 LAB — URINALYSIS, ROUTINE W REFLEX MICROSCOPIC
Bilirubin Urine: NEGATIVE
Glucose, UA: NEGATIVE mg/dL
Ketones, ur: NEGATIVE mg/dL
Nitrite: NEGATIVE
PROTEIN: NEGATIVE mg/dL
Specific Gravity, Urine: 1.025 (ref 1.005–1.030)
pH: 6 (ref 5.0–8.0)

## 2016-05-08 LAB — WET PREP, GENITAL
CLUE CELLS WET PREP: NONE SEEN
Sperm: NONE SEEN
Trich, Wet Prep: NONE SEEN
Yeast Wet Prep HPF POC: NONE SEEN

## 2016-05-08 LAB — POCT PREGNANCY, URINE: PREG TEST UR: POSITIVE — AB

## 2016-05-08 LAB — ABO/RH: ABO/RH(D): A POS

## 2016-05-08 LAB — HCG, QUANTITATIVE, PREGNANCY: HCG, BETA CHAIN, QUANT, S: 30299 m[IU]/mL — AB (ref <5)

## 2016-05-08 NOTE — MAU Note (Signed)
LMP 03/15/16 per patient.

## 2016-05-08 NOTE — Discharge Instructions (Signed)
Vaginal Bleeding During Pregnancy, First Trimester A small amount of bleeding (spotting) from the vagina is common in early pregnancy. Sometimes the bleeding is normal and is not a problem, and sometimes it is a sign of something serious. Be sure to tell your doctor about any bleeding from your vagina right away. Follow these instructions at home:  Watch your condition for any changes.  Follow your doctor's instructions about how active you can be.  If you are on bed rest:  You may need to stay in bed and only get up to use the bathroom.  You may be allowed to do some activities.  If you need help, make plans for someone to help you.  Write down:  The number of pads you use each day.  How often you change pads.  How soaked (saturated) your pads are.  Do not use tampons.  Do not douche.  Do not have sex or orgasms until your doctor says it is okay.  If you pass any tissue from your vagina, save the tissue so you can show it to your doctor.  Only take medicines as told by your doctor.  Do not take aspirin because it can make you bleed.  Keep all follow-up visits as told by your doctor. Contact a doctor if:  You bleed from your vagina.  You have cramps.  You have labor pains.  You have a fever that does not go away after you take medicine. Get help right away if:  You have very bad cramps in your back or belly (abdomen).  You pass large clots or tissue from your vagina.  You bleed more.  You feel light-headed or weak.  You pass out (faint).  You have chills.  You are leaking fluid or have a gush of fluid from your vagina.  You pass out while pooping (having a bowel movement). This information is not intended to replace advice given to you by your health care provider. Make sure you discuss any questions you have with your health care provider. Document Released: 08/04/2013 Document Revised: 08/26/2015 Document Reviewed: 11/25/2012 Elsevier Interactive  Patient Education  2017 [employer]  If you have heavy bleeding or severe abdominal pain call the office or return here. Keep your scheduled follow up appointment in the office.

## 2016-05-08 NOTE — MAU Note (Signed)
Patient presents to mau with c/o of dark red vaginal bleeding that she notices when wipes; not currently wearing a pad or tampon. States the bleeding started on Saturday and she endorses that she feels like it is getting worse. Denies pain at this time. States this is her first pregnancy and [facility] 5 [facility].

## 2016-05-08 NOTE — MAU Provider Note (Signed)
[facility] Provider Note   CSN: [id number] Arrival date & time: 05/08/16  4098     History   Chief Complaint Chief Complaint  Patient presents with  . Vaginal Bleeding    HPI Tina Dickson is a 21 y.o. G1P0000 @ [redacted]w[redacted]d gestation who presents to the MAU with vaginal bleeding (spotting) that started 3 days ago. Last sexual intercourse 2 weeks ago. Patient complains of mild cramping in the lower abdomen.   The history is provided by the patient. No language interpreter was used.  Vaginal Bleeding  This is a new problem. The current episode started in the past 7 days. The problem occurs intermittently. The problem has been unchanged. Associated symptoms include nausea. Pertinent negatives include no chills, congestion, coughing, fever, headaches, rash, sore throat or vomiting. Abdominal pain: cramping. She has tried nothing for the symptoms.    Past Medical History:  Diagnosis Date  . Abdominal pain, recurrent    characterized as crampy, spasmodic pain  . Asthma    as a child- not since 2010  . Chronic constipation     Patient Active Problem List   Diagnosis Date Noted  . Dysmenorrhea in adolescent 04/17/2014  . Vaginal discharge 04/17/2014  . Allergic rhinitis 08/04/2013  . Unspecified vitamin D deficiency 05/07/2013  . 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 Chronic constipation   . ECZEMA 12/24/2006    Past Surgical History:  Procedure Laterality Date  . HERNIA REPAIR  1998    OB History    Gravida Para Term Preterm AB Living   1 0 0 0 0 0   SAB TAB Ectopic Multiple Live Births   0 0 0 0         Home Medications    Prior to Admission medications   Medication Sig Start Date End Date Taking? Authorizing Provider  Prenatal Vit-Min-FA-Fish Oil ([facility] PRENATAL GUMMY PO) Take 1 tablet by mouth 2 (two) times daily. Take 1 tablet in the morning and 1 at night   Yes Historical Provider, MD  fluticasone (FLONASE) 50 MCG/ACT nasal spray 2 sprays each nostril in  each nostril in the morning and night. Patient not taking: Reported on 01/22/2015 01/13/15   Cherece Griffith Citron, MD  hydrocortisone 2.5 % lotion Apply topically 2 (two) times daily. Apply to affected area to improve redness/itching Patient not taking: Reported on 03/02/2015 01/22/15   Antony Madura, PA-C  hydrOXYzine (ATARAX/VISTARIL) 25 MG tablet Take 1 tablet (25 mg total) by mouth every 6 (six) hours as needed for itching. Patient not taking: Reported on 03/02/2015 01/22/15   Antony Madura, PA-C  ibuprofen (ADVIL,MOTRIN) 600 MG tablet Take 1 tablet (600 mg total) by mouth every 6 (six) hours as needed (for menstrual pain). Patient not taking: Reported on 05/08/2016 06/02/15   Gregor Hams, NP  trimethoprim-polymyxin b (POLYTRIM) ophthalmic solution Place 1 drop into the right eye 3 (three) times daily. Patient not taking: Reported on 05/08/2016 03/02/15   Armanda Heritage, MD    Family History Family History  Problem Relation Age of Onset  . Asthma Brother   . Lactose intolerance Mother   . Diabetes Father   . Cancer Maternal Grandmother   . Diabetes Paternal Grandfather   . Irritable bowel syndrome Neg Hx     Social History Social History  Substance Use Topics  . Smoking status: Never Smoker  . Smokeless tobacco: Never Used  . Alcohol use No     Allergies   Patient has no known allergies.  Review of Systems Review of Systems  Constitutional: Negative for chills and fever.  HENT: Negative for congestion, sinus pain and sore throat.   Eyes: Negative for visual disturbance.  Respiratory: Negative for cough and shortness of breath.   Gastrointestinal: Positive for nausea. Negative for vomiting. Abdominal pain: cramping.  Genitourinary: Positive for vaginal bleeding. Negative for dyspareunia, dysuria and frequency.  Musculoskeletal: Negative for back pain.  Skin: Negative for rash.  Neurological: Negative for dizziness and headaches.  Psychiatric/Behavioral: Negative for  confusion.     Physical Exam Updated Vital Signs BP 135/70 (BP Location: Right Arm) Comment: J. Rasch, NP in department and made aware of BP's  Pulse 84   Temp 98.7 F (37.1 C) (Oral)   Resp 18   Wt 202 lb 9.6 oz (91.9 kg)   LMP 03/15/2016   SpO2 100%   BMI 34.78 kg/m   Physical Exam  Constitutional: She is oriented to person, place, and time. She appears well-developed and well-nourished. No distress.  HENT:  Head: Normocephalic.  Eyes: EOM are normal.  Neck: Normal range of motion. Neck supple.  Cardiovascular: Normal rate and regular rhythm.   Pulmonary/Chest: Effort normal and breath sounds normal.  Abdominal: Soft. Bowel sounds are normal. There is no tenderness.  Genitourinary:  Genitourinary Comments: External genitalia without lesions, scant blood vaginal vault, cervix long, closed, no CMT, no adnexal tenderness or mass palpated. Uterus slightly enlarged.   Musculoskeletal: Normal range of motion.  Neurological: She is alert and oriented to person, place, and time. No cranial nerve deficit.  Skin: Skin is warm and dry.  Psychiatric: She has a normal mood and affect. Her behavior is normal.  Nursing note and vitals reviewed.    ED Treatments / Results  Labs (all labs ordered are listed, but only abnormal results are displayed) Labs Reviewed  WET PREP, GENITAL - Abnormal; Notable for the following:       Result Value   WBC, Wet Prep HPF POC MODERATE (*)    All other components within normal limits  URINALYSIS, ROUTINE W REFLEX MICROSCOPIC - Abnormal; Notable for the following:    APPearance HAZY (*)    Hgb urine dipstick MODERATE (*)    Leukocytes, UA TRACE (*)    Bacteria, UA RARE (*)    Squamous Epithelial / LPF 6-30 (*)    All other components within normal limits  HCG, QUANTITATIVE, PREGNANCY - Abnormal; Notable for the following:    hCG, Beta Chain, Quant, S 30,299 (*)    All other components within normal limits  POCT PREGNANCY, URINE - Abnormal;  Notable for the following:    Preg Test, Ur POSITIVE (*)    All other components within normal limits  CBC  RPR  HIV ANTIBODY (ROUTINE TESTING)  ABO/RH  GC/CHLAMYDIA PROBE AMP ([place]) NOT AT [facility]   Radiology [place] Ob Comp Less 14 Wks  Result Date: 05/08/2016 CLINICAL DATA:  Pregnant, vaginal bleeding x3 days EXAM: OBSTETRIC <14 WK US AND TRANSVAGINAL OB US TECHNIQUE: Both transabdominal and transvaginal ultrasound examinations were performed for complete evaluation of the gestation as well as the maternal uterus, adnexal regions, and pelvic cul-de-sac. Transvaginal technique was performed to assess early pregnancy. COMPARISON:  None. FINDINGS: Intrauterine gestational sac: Single Yolk sac:  Visualized. Embryo:  Visualized. Cardiac Activity: Visualized. Heart Rate: 121  bpm CRL:  6.1  mm   6 w   3 d  [place] EDC: 12/29/2016 Subchorionic hemorrhage:  None visualized. Maternal uterus/adnexae: Bilateral ovaries are within normal limits. Trace pelvic fluid. IMPRESSION: Single live intrauterine gestation, with estimated gestational age [redacted] weeks 3 days by crown-rump length. Electronically Signed   By: Charline Bills M.D.   On: 05/08/2016 11:51   US Ob Transvaginal  Result Date: 05/08/2016 CLINICAL DATA:  Pregnant, vaginal bleeding x3 days EXAM: OBSTETRIC <14 WK [place] AND TRANSVAGINAL OB US TECHNIQUE: Both transabdominal and transvaginal ultrasound examinations were performed for complete evaluation of the gestation as well as the maternal uterus, adnexal regions, and pelvic cul-de-sac. Transvaginal technique was performed to assess early pregnancy. COMPARISON:  None. FINDINGS: Intrauterine gestational sac: Single Yolk sac:  Visualized. Embryo:  Visualized. Cardiac Activity: Visualized. Heart Rate: 121  bpm CRL:  6.1  mm   6 w   3 d                  [place] EDC: 12/29/2016 Subchorionic hemorrhage:  None visualized. Maternal uterus/adnexae: Bilateral ovaries are within normal limits. Trace pelvic fluid.  IMPRESSION: Single live intrauterine gestation, with estimated gestational age [redacted] weeks 3 days by crown-rump length. Electronically Signed   By: Charline Bills M.D.   On: 05/08/2016 11:51    Procedures Procedures (including critical care time)  Medications Ordered in ED Medications - No data to display   Initial Impression / Assessment and Plan / ED Course  I have reviewed the triage vital signs and the nursing notes.  Pertinent labs & imaging results that were available during my care of the patient were reviewed by me and considered in my medical decision making (see chart for details).  Consult with Dr. Senaida Ores @ 12:25 pm and she agrees with plan.   Final Clinical Impressions(s) / ED Diagnoses  20 y.o. G1P0000 @ [redacted]w[redacted]d gestation stable for d/c without heavy bleeding and viable IUP on ultrasound. She will keep he f/u appointment in the office. She will return sooner for any problems. Return precautions given.  Final diagnoses:  Bleeding in early pregnancy    New Prescriptions Current Discharge Medication List

## 2016-05-09 LAB — HIV ANTIBODY (ROUTINE TESTING W REFLEX): HIV SCREEN 4TH GENERATION: NONREACTIVE

## 2016-05-09 LAB — GC/CHLAMYDIA PROBE AMP (~~LOC~~) NOT AT ARMC
CHLAMYDIA, DNA PROBE: NEGATIVE
NEISSERIA GONORRHEA: NEGATIVE

## 2016-05-09 LAB — RPR: RPR: NONREACTIVE

## 2016-05-23 LAB — OB RESULTS CONSOLE HEPATITIS B SURFACE ANTIGEN: Hepatitis B Surface Ag: NEGATIVE

## 2016-05-23 LAB — OB RESULTS CONSOLE GC/CHLAMYDIA: Gonorrhea: NEGATIVE

## 2016-05-23 LAB — OB RESULTS CONSOLE RUBELLA ANTIBODY, IGM: Rubella: IMMUNE

## 2016-05-27 ENCOUNTER — Inpatient Hospital Stay (HOSPITAL_COMMUNITY)
Admission: AD | Admit: 2016-05-27 | Discharge: 2016-05-27 | Disposition: A | Discharge status: Home / Self Care | Payer: BLUE CROSS/BLUE SHIELD | Source: Ambulatory Visit | Attending: Obstetrics and Gynecology | Admitting: Obstetrics and Gynecology

## 2016-05-27 ENCOUNTER — Encounter (HOSPITAL_COMMUNITY): Payer: Self-pay | Admitting: *Deleted

## 2016-05-27 DIAGNOSIS — O99611 Diseases of the digestive system complicating pregnancy, first trimester: Secondary | ICD-10-CM | POA: Diagnosis not present

## 2016-05-27 DIAGNOSIS — K5909 Other constipation: Secondary | ICD-10-CM | POA: Diagnosis not present

## 2016-05-27 DIAGNOSIS — R112 Nausea with vomiting, unspecified: Secondary | ICD-10-CM | POA: Insufficient documentation

## 2016-05-27 DIAGNOSIS — O218 Other vomiting complicating pregnancy: Secondary | ICD-10-CM | POA: Insufficient documentation

## 2016-05-27 DIAGNOSIS — O99511 Diseases of the respiratory system complicating pregnancy, first trimester: Secondary | ICD-10-CM | POA: Diagnosis not present

## 2016-05-27 DIAGNOSIS — R109 Unspecified abdominal pain: Secondary | ICD-10-CM | POA: Diagnosis present

## 2016-05-27 DIAGNOSIS — Z3A09 9 weeks gestation of pregnancy: Secondary | ICD-10-CM | POA: Insufficient documentation

## 2016-05-27 DIAGNOSIS — K219 Gastro-esophageal reflux disease without esophagitis: Secondary | ICD-10-CM | POA: Diagnosis not present

## 2016-05-27 DIAGNOSIS — O219 Vomiting of pregnancy, unspecified: Secondary | ICD-10-CM | POA: Diagnosis not present

## 2016-05-27 DIAGNOSIS — R1110 Vomiting, unspecified: Secondary | ICD-10-CM | POA: Diagnosis present

## 2016-05-27 LAB — URINALYSIS, ROUTINE W REFLEX MICROSCOPIC
Bilirubin Urine: NEGATIVE
Glucose, UA: NEGATIVE mg/dL
HGB URINE DIPSTICK: NEGATIVE
Ketones, ur: 5 mg/dL — AB
NITRITE: NEGATIVE
PH: 7 (ref 5.0–8.0)
PROTEIN: 30 mg/dL — AB
SPECIFIC GRAVITY, URINE: 1.026 (ref 1.005–1.030)

## 2016-05-27 MED ORDER — METOCLOPRAMIDE HCL 5 MG/ML IJ SOLN
10.0000 mg | Freq: Once | INTRAMUSCULAR | Status: AC
  Administered 2016-05-27: 10 mg via INTRAMUSCULAR
  Filled 2016-05-27: qty 2

## 2016-05-27 MED ORDER — DOXYLAMINE-PYRIDOXINE 10-10 MG PO TBEC: 10.0000 mg | DELAYED_RELEASE_TABLET | Freq: Two times a day (BID) | ORAL | 2 refills | Status: DC

## 2016-05-27 MED ORDER — FAMOTIDINE 20 MG PO TABS
40.0000 mg | ORAL_TABLET | Freq: Once | ORAL | Status: AC
  Administered 2016-05-27: 40 mg via ORAL
  Filled 2016-05-27: qty 2

## 2016-05-27 MED ORDER — METOCLOPRAMIDE HCL 10 MG PO TABS: 10.0000 mg | ORAL_TABLET | Freq: Three times a day (TID) | ORAL | 1 refills | Status: DC

## 2016-05-27 MED ORDER — METOCLOPRAMIDE HCL 5 MG/ML IJ SOLN: 10.0000 mg | Freq: Once | INTRAMUSCULAR | Status: DC

## 2016-05-27 NOTE — MAU Provider Note (Signed)
History     CSN: [id number]  Arrival date and time: 05/27/16 1115   First Provider Initiated Contact with Patient 05/27/16 1206      Chief Complaint  Patient presents with  . Emesis During Pregnancy  . Abdominal Pain   HPI   Ms.Tina Dickson is a 21 y.o. female G1P0000 @ [redacted]w[redacted]d here in MAU with upper abdominal pain and N/V. She has vomited 2 times in the last 24 hours. She does not have any medication at home for the N/V. She has not eaten anything today. She tried to drink water however she vomited.  She is without vaginal bleeding or lower abdominal pain.   OB History    Gravida Para Term Preterm AB Living   1 0 0 0 0 0   SAB TAB Ectopic Multiple Live Births   0 0 0 0        Past Medical History:  Diagnosis Date  . Abdominal pain, recurrent    characterized as crampy, spasmodic pain  . Asthma    as a child- not since 2010  . Chronic constipation     Past Surgical History:  Procedure Laterality Date  . HERNIA REPAIR  1998    Family History  Problem Relation Age of Onset  . Asthma Brother   . Lactose intolerance Mother   . Diabetes Father   . Cancer Maternal Grandmother   . Diabetes Paternal Grandfather   . Irritable bowel syndrome Neg Hx     Social History  Substance Use Topics  . Smoking status: Never Smoker  . Smokeless tobacco: Never Used  . Alcohol use No    Allergies: No Known Allergies  Prescriptions Prior to Admission  Medication Sig Dispense Refill Last Dose  . Prenatal MV-Min-FA-Omega-3 (PRENATAL GUMMIES/DHA & FA) 0.4-32.5 MG CHEW Chew 1 each by mouth 2 (two) times daily.   05/26/2016 at Unknown time   Results for orders placed or performed during the hospital encounter of 05/27/16 (from the past 48 hour(s))  Urinalysis, Routine w reflex microscopic     Status: Abnormal   Collection Time: 05/27/16 11:23 AM  Result Value Ref Range   Color, Urine YELLOW YELLOW   APPearance CLOUDY (A) CLEAR   Specific Gravity, Urine 1.026 1.005 - 1.030   pH 7.0 5.0 - 8.0   Glucose, UA NEGATIVE NEGATIVE mg/dL   Hgb urine dipstick NEGATIVE NEGATIVE   Bilirubin Urine NEGATIVE NEGATIVE   Ketones, ur 5 (A) NEGATIVE mg/dL   Protein, ur 30 (A) NEGATIVE mg/dL   Nitrite NEGATIVE NEGATIVE   Leukocytes, UA MODERATE (A) NEGATIVE   RBC / HPF 0-5 0 - 5 RBC/hpf   WBC, UA 6-30 0 - 5 WBC/hpf   Bacteria, UA RARE (A) NONE SEEN   Squamous Epithelial / LPF 6-30 (A) NONE SEEN   Mucous PRESENT    Review of Systems  Gastrointestinal: Positive for nausea and vomiting.   Physical Exam   Blood pressure 124/74, pulse 81, temperature 98.6 F (37 C), temperature source Oral, resp. rate 18, height 5\' 4"  (1.626 m), weight 198 lb (89.8 kg), last menstrual period 03/15/2016.  Physical Exam  Constitutional: She is oriented to person, place, and time. She appears well-developed and well-nourished. No distress.  HENT:  Head: Normocephalic.  Respiratory: Effort normal.  GI: Normal appearance. There is tenderness in the epigastric area.  Musculoskeletal: Normal range of motion.  Neurological: She is alert and oriented to person, place, and time.  Skin: Skin is warm. She is  not diaphoretic.  Psychiatric: Her behavior is normal.    MAU Course  Procedures  None  MDM  UA without signs of severe dehydration.  Reglan 10 mg IM Pepcid 40 mg PO  Patient tolerating PO fluids. Pain is gone   Assessment and Plan   A:  1. Nausea and vomiting in pregnancy   2. Gastroesophageal reflux disease, esophagitis presence not specified     P:  Discharge home in stable condition  Rx: Diclegis, Reglan (fill only if Diclegis is to costly) Return to MAU if symptoms worsen Follow up with OB Small, frequent meals   Tina LopeJennifer I Ashayla Subia, NP 05/27/2016 3:21 PM

## 2016-05-27 NOTE — MAU Note (Signed)
Pt has been having nausea for "a while,"  Is eating small meals.  Started vomiting Thursday, had to miss work.  Was some better yesterday, ate last night.  Started vomiting again this morning, feeling weak.  Denies diarrhea.  Having mid & upper abdominal, is worse at night.  No vaginal bleeding.

## 2016-05-27 NOTE — Discharge Instructions (Signed)
Nausea and Vomiting, Adult Introduction Feeling sick to your stomach (nausea) means that your stomach is upset or you feel like you have to throw up (vomit). Feeling more and more sick to your stomach can lead to throwing up. Throwing up happens when food and liquid from your stomach are thrown up and out the mouth. Throwing up can make you feel weak and cause you to get dehydrated. Dehydration can make you tired and thirsty, make you have a dry mouth, and make it so you pee (urinate) less often. Older adults and people with other diseases or a weak defense system (immune system) are at higher risk for dehydration. If you feel sick to your stomach or if you throw up, it is important to follow instructions from your doctor about how to take care of yourself. Follow these instructions at home: Eating and drinking Follow these instructions as told by your doctor:  Take an oral rehydration solution (ORS). This is a drink that is sold at pharmacies and stores.  Drink clear fluids in small amounts as you are able, such as:  Water.  Ice chips.  Diluted fruit juice.  Low-calorie sports drinks.  Eat bland, easy-to-digest foods in small amounts as you are able, such as:  Bananas.  Applesauce.  Rice.  Low-fat (lean) meats.  Toast.  Crackers.  Avoid fluids that have a lot of sugar or caffeine in them.  Avoid alcohol.  Avoid spicy or fatty foods. General instructions  Drink enough fluid to keep your pee (urine) clear or pale yellow.  Wash your hands often. If you cannot use soap and water, use hand sanitizer.  Make sure that all people in your home wash their hands well and often.  Take over-the-counter and prescription medicines only as told by your doctor.  Rest at home while you get better.  Watch your condition for any changes.  Breathe slowly and deeply when you feel sick to your stomach.  Keep all follow-up visits as told by your doctor. This is important. Contact a  doctor if:  You have a fever.  You cannot keep fluids down.  Your symptoms get worse.  You have new symptoms.  You feel sick to your stomach for more than two days.  You feel light-headed or dizzy.  You have a headache.  You have muscle cramps. Get help right away if:  You have pain in your chest, neck, arm, or jaw.  You feel very weak or you pass out (faint).  You throw up again and again.  You see blood in your throw-up.  Your throw-up looks like black coffee grounds.  You have bloody or black poop (stools) or poop that look like tar.  You have a very bad headache, a stiff neck, or both.  You have a rash.  You have very bad pain, cramping, or bloating in your belly (abdomen).  You have trouble breathing.  You are breathing very quickly.  Your heart is beating very quickly.  Your skin feels cold and clammy.  You feel confused.  You have pain when you pee.  You have signs of dehydration, such as:  Dark pee, hardly any pee, or no pee.  Cracked lips.  Dry mouth.  Sunken eyes.  Sleepiness.  Weakness. These symptoms may be an emergency. Do not wait to see if the symptoms will go away. Get medical help right away. Call your local emergency services (911 in the [place] Do not drive yourself to the hospital.  This information   is not intended to replace advice given to you by your health care provider. Make sure you discuss any questions you have with your health care provider. Document Released: 09/06/2007 Document Revised: 10/08/2015 Document Reviewed: 11/24/2014  2017 Elsevier  

## 2016-06-23 ENCOUNTER — Inpatient Hospital Stay (HOSPITAL_COMMUNITY)
Admission: AD | Admit: 2016-06-23 | Discharge: 2016-06-23 | Disposition: A | Discharge status: Home / Self Care | Payer: BLUE CROSS/BLUE SHIELD | Source: Ambulatory Visit | Attending: Obstetrics and Gynecology | Admitting: Obstetrics and Gynecology

## 2016-06-23 ENCOUNTER — Encounter (HOSPITAL_COMMUNITY): Payer: Self-pay | Admitting: *Deleted

## 2016-06-23 DIAGNOSIS — K5909 Other constipation: Secondary | ICD-10-CM | POA: Diagnosis not present

## 2016-06-23 DIAGNOSIS — O99611 Diseases of the digestive system complicating pregnancy, first trimester: Secondary | ICD-10-CM | POA: Diagnosis not present

## 2016-06-23 DIAGNOSIS — K529 Noninfective gastroenteritis and colitis, unspecified: Secondary | ICD-10-CM | POA: Insufficient documentation

## 2016-06-23 DIAGNOSIS — Z3A13 13 weeks gestation of pregnancy: Secondary | ICD-10-CM | POA: Insufficient documentation

## 2016-06-23 DIAGNOSIS — R112 Nausea with vomiting, unspecified: Secondary | ICD-10-CM | POA: Diagnosis present

## 2016-06-23 LAB — URINALYSIS, ROUTINE W REFLEX MICROSCOPIC
Bilirubin Urine: NEGATIVE
Glucose, UA: NEGATIVE mg/dL
Hgb urine dipstick: NEGATIVE
Ketones, ur: 80 mg/dL — AB
LEUKOCYTES UA: NEGATIVE
Nitrite: NEGATIVE
PH: 5 (ref 5.0–8.0)
Protein, ur: 30 mg/dL — AB
SPECIFIC GRAVITY, URINE: 1.031 — AB (ref 1.005–1.030)

## 2016-06-23 LAB — CBC WITH DIFFERENTIAL/PLATELET
Basophils Absolute: 0 10*3/uL (ref 0.0–0.1)
Basophils Relative: 0 %
Eosinophils Absolute: 0 10*3/uL (ref 0.0–0.7)
Eosinophils Relative: 0 %
HEMATOCRIT: 37.4 % (ref 36.0–46.0)
HEMOGLOBIN: 13.1 g/dL (ref 12.0–15.0)
LYMPHS ABS: 0.7 10*3/uL (ref 0.7–4.0)
LYMPHS PCT: 13 %
MCH: 27.7 pg (ref 26.0–34.0)
MCHC: 35 g/dL (ref 30.0–36.0)
MCV: 79.1 fL (ref 78.0–100.0)
Monocytes Absolute: 0.3 10*3/uL (ref 0.1–1.0)
Monocytes Relative: 5 %
NEUTROS ABS: 4.2 10*3/uL (ref 1.7–7.7)
NEUTROS PCT: 82 %
Platelets: 179 10*3/uL (ref 150–400)
RBC: 4.73 MIL/uL (ref 3.87–5.11)
RDW: 13 % (ref 11.5–15.5)
WBC: 5.2 10*3/uL (ref 4.0–10.5)

## 2016-06-23 LAB — COMPREHENSIVE METABOLIC PANEL
ALK PHOS: 47 U/L (ref 38–126)
ALT: 30 U/L (ref 14–54)
ANION GAP: 6 (ref 5–15)
AST: 23 U/L (ref 15–41)
Albumin: 4 g/dL (ref 3.5–5.0)
BILIRUBIN TOTAL: 0.5 mg/dL (ref 0.3–1.2)
BUN: 6 mg/dL (ref 6–20)
CALCIUM: 9.1 mg/dL (ref 8.9–10.3)
CO2: 24 mmol/L (ref 22–32)
CREATININE: 0.67 mg/dL (ref 0.44–1.00)
Chloride: 104 mmol/L (ref 101–111)
GFR calc non Af Amer: >60 mL/min (ref >60)
GLUCOSE: 90 mg/dL (ref 65–99)
Potassium: 3.4 mmol/L — ABNORMAL LOW (ref 3.5–5.1)
Sodium: 134 mmol/L — ABNORMAL LOW (ref 135–145)
TOTAL PROTEIN: 7.5 g/dL (ref 6.5–8.1)

## 2016-06-23 MED ORDER — FAMOTIDINE IN NACL 20-0.9 MG/50ML-% IV SOLN
20.0000 mg | Freq: Once | INTRAVENOUS | Status: AC
  Administered 2016-06-23: 20 mg via INTRAVENOUS
  Filled 2016-06-23: qty 50

## 2016-06-23 MED ORDER — SODIUM CHLORIDE 0.9 % IV SOLN
8.0000 mg | Freq: Once | INTRAVENOUS | Status: AC
  Administered 2016-06-23: 8 mg via INTRAVENOUS
  Filled 2016-06-23: qty 4

## 2016-06-23 MED ORDER — PROMETHAZINE HCL 25 MG PO TABS: 25.0000 mg | ORAL_TABLET | Freq: Four times a day (QID) | ORAL | 0 refills | Status: DC | PRN

## 2016-06-23 MED ORDER — PROMETHAZINE HCL 25 MG/ML IJ SOLN
25.0000 mg | Freq: Once | INTRAVENOUS | Status: AC
  Administered 2016-06-23: 25 mg via INTRAVENOUS
  Filled 2016-06-23: qty 1

## 2016-06-23 MED ORDER — M.V.I. ADULT IV INJ
Freq: Once | INTRAVENOUS | Status: AC
  Administered 2016-06-23: 14:00:00 via INTRAVENOUS
  Filled 2016-06-23: qty 10

## 2016-06-23 NOTE — MAU Note (Signed)
Patient c/o vomiting x2 days and generalized aches. States she has felt hot but not taken temperature. Has not been able to hold anything down. Has not vomited today.

## 2016-06-23 NOTE — MAU Provider Note (Signed)
History     CSN: [id number]  Arrival date and time: 06/23/16 1116   First Provider Initiated Contact with Patient 06/23/16 1226      Chief Complaint  Patient presents with  . Emesis   HPI Tina Dickson is a 21 y.o. G1P0000 at [redacted]w[redacted]d who presents with nausea & vomiting. Symptoms began yesterday. States she was exposed to several family members who had "GI bug". States she vomited countless times yesterday & 4 times today. Felt hot but did not check temperature. States she has been unable to hold down food or fluids today. Associated symptoms include epigastric soreness & body aches. Denies abdominal pain, vaginal bleeding, or diarrhea. Has been taking diclegis without relief.   OB History    Gravida Para Term Preterm AB Living   1 0 0 0 0 0   SAB TAB Ectopic Multiple Live Births   0 0 0 0        Past Medical History:  Diagnosis Date  . Abdominal pain, recurrent    characterized as crampy, spasmodic pain  . Asthma    as a child- not since 2010  . Chronic constipation     Past Surgical History:  Procedure Laterality Date  . HERNIA REPAIR  1998    Family History  Problem Relation Age of Onset  . Asthma Brother   . Lactose intolerance Mother   . Diabetes Father   . Cancer Maternal Grandmother   . Diabetes Paternal Grandfather   . Irritable bowel syndrome Neg Hx     Social History  Substance Use Topics  . Smoking status: Never Smoker  . Smokeless tobacco: Never Used  . Alcohol use No    Allergies: No Known Allergies  Prescriptions Prior to Admission  Medication Sig Dispense Refill Last Dose  . Doxylamine-Pyridoxine 10-10 MG TBEC Take 10 mg by mouth 2 (two) times daily. Take 2 tablets at bedtime. If symptoms persist, add 1 tab in the AM starting on day 3. If symptoms persist, add 1 tab in the PM starting day 4. 90 tablet 2   . metoCLOPramide (REGLAN) 10 MG tablet Take 1 tablet (10 mg total) by mouth 4 (four) times daily -  before meals and at bedtime. 30 tablet  1   . Prenatal MV-Min-FA-Omega-3 (PRENATAL GUMMIES/DHA & FA) 0.4-32.5 MG CHEW Chew 1 each by mouth 2 (two) times daily.   05/26/2016 at Unknown time    Review of Systems  Constitutional: Negative for chills and fever.  Gastrointestinal: Positive for nausea and vomiting. Negative for abdominal pain, constipation and diarrhea.  Genitourinary: Negative.   Neurological: Negative for headaches.   Physical Exam   Blood pressure (!) 115/58, pulse 94, temperature 99.1 F (37.3 C), temperature source Oral, resp. rate 16, weight 193 lb 0.6 oz (87.6 kg), last menstrual period 03/15/2016, SpO2 99 %.  Physical Exam  Nursing note and vitals reviewed. Constitutional: She is oriented to person, place, and time. She appears well-developed and well-nourished. No distress.  HENT:  Head: Normocephalic and atraumatic.  Eyes: Conjunctivae are normal. Right eye exhibits no discharge. Left eye exhibits no discharge. No scleral icterus.  Neck: Normal range of motion.  Cardiovascular: Normal rate, regular rhythm and normal heart sounds.   No murmur heard. Respiratory: Effort normal and breath sounds normal. No respiratory distress. She has no wheezes.  GI: Soft. Bowel sounds are normal. There is no tenderness. There is no rebound and no guarding.  Neurological: She is alert and oriented to person, place,  and time.  Skin: Skin is warm and dry. She is not diaphoretic.  Psychiatric: She has a normal mood and affect. Her behavior is normal. Judgment and thought content normal.   MAU Course  Procedures Results for orders placed or performed during the hospital encounter of 06/23/16 (from the past 24 hour(s))  Urinalysis, Routine w reflex microscopic     Status: Abnormal   Collection Time: 06/23/16 11:29 AM  Result Value Ref Range   Color, Urine YELLOW YELLOW   APPearance HAZY (A) CLEAR   Specific Gravity, Urine 1.031 (H) 1.005 - 1.030   pH 5.0 5.0 - 8.0   Glucose, UA NEGATIVE NEGATIVE mg/dL   Hgb urine  dipstick NEGATIVE NEGATIVE   Bilirubin Urine NEGATIVE NEGATIVE   Ketones, ur 80 (A) NEGATIVE mg/dL   Protein, ur 30 (A) NEGATIVE mg/dL   Nitrite NEGATIVE NEGATIVE   Leukocytes, UA NEGATIVE NEGATIVE   RBC / HPF 0-5 0 - 5 RBC/hpf   WBC, UA 0-5 0 - 5 WBC/hpf   Bacteria, UA RARE (A) NONE SEEN   Squamous Epithelial / LPF 0-5 (A) NONE SEEN   Mucous PRESENT   CBC with Differential/Platelet     Status: None   Collection Time: 06/23/16 12:58 PM  Result Value Ref Range   WBC 5.2 4.0 - 10.5 K/uL   RBC 4.73 3.87 - 5.11 MIL/uL   Hemoglobin 13.1 12.0 - 15.0 g/dL   HCT 16.1 09.6 - 04.5 %   MCV 79.1 78.0 - 100.0 fL   MCH 27.7 26.0 - 34.0 pg   MCHC 35.0 30.0 - 36.0 g/dL   RDW 40.9 81.1 - 91.4 %   Platelets 179 150 - 400 K/uL   Neutrophils Relative % 82 %   Neutro Abs 4.2 1.7 - 7.7 K/uL   Lymphocytes Relative 13 %   Lymphs Abs 0.7 0.7 - 4.0 K/uL   Monocytes Relative 5 %   Monocytes Absolute 0.3 0.1 - 1.0 K/uL   Eosinophils Relative 0 %   Eosinophils Absolute 0.0 0.0 - 0.7 K/uL   Basophils Relative 0 %   Basophils Absolute 0.0 0.0 - 0.1 K/uL  Comprehensive metabolic panel     Status: Abnormal   Collection Time: 06/23/16 12:58 PM  Result Value Ref Range   Sodium 134 (L) 135 - 145 mmol/L   Potassium 3.4 (L) 3.5 - 5.1 mmol/L   Chloride 104 101 - 111 mmol/L   CO2 24 22 - 32 mmol/L   Glucose, Bld 90 65 - 99 mg/dL   BUN 6 6 - 20 mg/dL   Creatinine, Ser 7.82 0.44 - 1.00 mg/dL   Calcium 9.1 8.9 - 95.6 mg/dL   Total Protein 7.5 6.5 - 8.1 g/dL   Albumin 4.0 3.5 - 5.0 g/dL   AST 23 15 - 41 U/L   ALT 30 14 - 54 U/L   Alkaline Phosphatase 47 38 - 126 U/L   Total Bilirubin 0.5 0.3 - 1.2 mg/dL   GFR calc non Af Amer >60 >60 mL/min   GFR calc Af Amer >60 >60 mL/min   Anion gap 6 5 - 15    MDM FHT 175 by doppler U/a shows >80 ketones & SG of 1.031 Phenergan 25 mg in bag of D5LR followed by MVI in LR Pepcid 20 mg IV Pt vomited once more after medications -- zofran 8 mg IV ordered Patient  reports improvement in symptoms S/w Dr. Senaida Ores. Ok to discharge home with additional antiemetics.  Assessment and Plan  A: 1. Gastroenteritis, acute    P: Discharge home Rx phenergan Discussed reasons to return to MAU Keep follow up appointment with OB/PCP  Advance diet as tolerated, good hand hygiene, don't prepare food for others   Tina Dickson 06/23/2016, 12:26 PM

## 2016-06-23 NOTE — Discharge Instructions (Signed)

## 2016-12-01 ENCOUNTER — Inpatient Hospital Stay (HOSPITAL_COMMUNITY)
Admission: AD | Admit: 2016-12-01 | Discharge: 2016-12-02 | Disposition: A | Discharge status: Home / Self Care | Payer: Medicaid Other | Source: Ambulatory Visit | Attending: Obstetrics and Gynecology | Admitting: Obstetrics and Gynecology

## 2016-12-01 ENCOUNTER — Encounter (HOSPITAL_COMMUNITY): Payer: Self-pay | Admitting: *Deleted

## 2016-12-01 DIAGNOSIS — Z9889 Other specified postprocedural states: Secondary | ICD-10-CM | POA: Insufficient documentation

## 2016-12-01 DIAGNOSIS — Z833 Family history of diabetes mellitus: Secondary | ICD-10-CM | POA: Diagnosis not present

## 2016-12-01 DIAGNOSIS — N898 Other specified noninflammatory disorders of vagina: Secondary | ICD-10-CM | POA: Insufficient documentation

## 2016-12-01 DIAGNOSIS — Z825 Family history of asthma and other chronic lower respiratory diseases: Secondary | ICD-10-CM | POA: Insufficient documentation

## 2016-12-01 DIAGNOSIS — O479 False labor, unspecified: Secondary | ICD-10-CM

## 2016-12-01 DIAGNOSIS — O4693 Antepartum hemorrhage, unspecified, third trimester: Secondary | ICD-10-CM | POA: Insufficient documentation

## 2016-12-01 DIAGNOSIS — K5909 Other constipation: Secondary | ICD-10-CM | POA: Insufficient documentation

## 2016-12-01 DIAGNOSIS — Z8379 Family history of other diseases of the digestive system: Secondary | ICD-10-CM | POA: Insufficient documentation

## 2016-12-01 DIAGNOSIS — O26893 Other specified pregnancy related conditions, third trimester: Secondary | ICD-10-CM | POA: Insufficient documentation

## 2016-12-01 DIAGNOSIS — O99513 Diseases of the respiratory system complicating pregnancy, third trimester: Secondary | ICD-10-CM | POA: Insufficient documentation

## 2016-12-01 DIAGNOSIS — Z809 Family history of malignant neoplasm, unspecified: Secondary | ICD-10-CM | POA: Insufficient documentation

## 2016-12-01 DIAGNOSIS — O4703 False labor before 37 completed weeks of gestation, third trimester: Secondary | ICD-10-CM | POA: Insufficient documentation

## 2016-12-01 DIAGNOSIS — Z3A36 36 weeks gestation of pregnancy: Secondary | ICD-10-CM | POA: Insufficient documentation

## 2016-12-01 DIAGNOSIS — Z79899 Other long term (current) drug therapy: Secondary | ICD-10-CM | POA: Diagnosis not present

## 2016-12-01 LAB — WET PREP, GENITAL
CLUE CELLS WET PREP: NONE SEEN
SPERM: NONE SEEN
TRICH WET PREP: NONE SEEN
YEAST WET PREP: NONE SEEN

## 2016-12-01 LAB — POCT FERN TEST: POCT Fern Test: NEGATIVE

## 2016-12-01 LAB — URINALYSIS, ROUTINE W REFLEX MICROSCOPIC
BILIRUBIN URINE: NEGATIVE
Glucose, UA: NEGATIVE mg/dL
Ketones, ur: NEGATIVE mg/dL
Nitrite: NEGATIVE
Protein, ur: 100 mg/dL — AB
SPECIFIC GRAVITY, URINE: 1.03 (ref 1.005–1.030)
pH: 5 (ref 5.0–8.0)

## 2016-12-01 NOTE — MAU Note (Signed)
Had GBS test this am and checked my cervix as well. Cervix closed. Tonight went to [facility] and "gushed" dark blood. Burned tonight when urinated which was first time I had gone since this am.

## 2016-12-01 NOTE — MAU Provider Note (Signed)
History     CSN: [id number]  Arrival date and time: 12/01/16 2240   First Provider Initiated Contact with Patient 12/01/16 2331      Chief Complaint  Patient presents with  . Vaginal Bleeding   Tina Dickson is a 21 y.o. G1P0000 at [redacted]w[redacted]d who presents today with vaginal bleeding. She denies any complications with the pregnancy. She rpeorts normal fetal movement. She denies any contractions.    Vaginal Bleeding  The patient's primary symptoms include vaginal bleeding and vaginal discharge. The patient's pertinent negatives include no pelvic pain. This is a new problem. The current episode started today. The problem has been unchanged. The patient is experiencing no pain. Pertinent negatives include no chills, dysuria, fever, nausea or vomiting. Vaginal discharge characteristics: "gush of pink blood" "still having some leaking" The vaginal bleeding is lighter than menses. She has not been passing clots. She has not been passing tissue. Exacerbated by: had a cervical exam and GBS today. Patient states that it was uncomfortable. Bleeding started afterward.    Past Medical History:  Diagnosis Date  . Abdominal pain, recurrent    characterized as crampy, spasmodic pain  . Asthma    as a child- not since 2010  . Chronic constipation     Past Surgical History:  Procedure Laterality Date  . HERNIA REPAIR  1998    Family History  Problem Relation Age of Onset  . Asthma Brother   . Lactose intolerance Mother   . Diabetes Father   . Cancer Maternal Grandmother   . Diabetes Paternal Grandfather   . Irritable bowel syndrome Neg Hx     Social History  Substance Use Topics  . Smoking status: Never Smoker  . Smokeless tobacco: Never Used  . Alcohol use No    Allergies: No Known Allergies  Prescriptions Prior to Admission  Medication Sig Dispense Refill Last Dose  . Doxylamine-Pyridoxine 10-10 MG TBEC Take 10 mg by mouth 2 (two) times daily. Take 2 tablets at bedtime. If  symptoms persist, add 1 tab in the AM starting on day 3. If symptoms persist, add 1 tab in the PM starting day 4. 90 tablet 2 06/22/2016 at Unknown time  . Prenatal MV-Min-FA-Omega-3 (PRENATAL GUMMIES/DHA & FA) 0.4-32.5 MG CHEW Chew 1 each by mouth 2 (two) times daily.   Past Week at Unknown time  . promethazine (PHENERGAN) 25 MG tablet Take 1 tablet (25 mg total) by mouth every 6 (six) hours as needed for nausea or vomiting. 30 tablet 0     Review of Systems  Constitutional: Negative for chills and fever.  Gastrointestinal: Negative for nausea and vomiting.  Genitourinary: Positive for vaginal bleeding and vaginal discharge. Negative for dysuria and pelvic pain.   Physical Exam   Blood pressure 140/79, pulse 82, temperature 98.3 F (36.8 C), resp. rate 18, height 5\' 3"  (1.6 m), weight 207 lb (93.9 kg), last menstrual period 03/15/2016.  Physical Exam  Nursing note and vitals reviewed. Constitutional: She is oriented to person, place, and time. She appears well-developed and well-nourished. No distress.  HENT:  Head: Normocephalic.  Cardiovascular: Normal rate.   Respiratory: Effort normal.  GI: Soft. There is no tenderness. There is no rebound.  Genitourinary:  Genitourinary Comments:  External: no lesion Vagina: small amount of white discharge. No pooling. No blood seen  Cervix: pink, smooth, no fluid seen with valsalva. Closed/thick Uterus: AGA   Neurological: She is alert and oriented to person, place, and time.  Skin: Skin is warm  and dry.  Psychiatric: She has a normal mood and affect.   FHT: 145, moderate with 15x15 accels, no decels Toco: about 3-5 mins. Patient reports that she is not feeling them.   Results for orders placed or performed during the hospital encounter of 12/01/16 (from the past 24 hour(s))  Urinalysis, Routine w reflex microscopic     Status: Abnormal   Collection Time: 12/01/16 11:00 PM  Result Value Ref Range   Color, Urine YELLOW YELLOW    APPearance CLOUDY (A) CLEAR   Specific Gravity, Urine 1.030 1.005 - 1.030   pH 5.0 5.0 - 8.0   Glucose, UA NEGATIVE NEGATIVE mg/dL   Hgb urine dipstick LARGE (A) NEGATIVE   Bilirubin Urine NEGATIVE NEGATIVE   Ketones, ur NEGATIVE NEGATIVE mg/dL   Protein, ur 409100 (A) NEGATIVE mg/dL   Nitrite NEGATIVE NEGATIVE   Leukocytes, UA SMALL (A) NEGATIVE   RBC / HPF TOO NUMEROUS TO COUNT 0 - 5 RBC/hpf   WBC, UA TOO NUMEROUS TO COUNT 0 - 5 WBC/hpf   Bacteria, UA MANY (A) NONE SEEN   Squamous Epithelial / LPF 6-30 (A) NONE SEEN   Mucus PRESENT   Wet prep, genital     Status: Abnormal   Collection Time: 12/01/16 11:40 PM  Result Value Ref Range   Yeast Wet Prep HPF POC NONE SEEN NONE SEEN   Trich, Wet Prep NONE SEEN NONE SEEN   Clue Cells Wet Prep HPF POC NONE SEEN NONE SEEN   WBC, Wet Prep HPF POC MODERATE (A) NONE SEEN   Sperm NONE SEEN   Fern Test     Status: Normal   Collection Time: 12/01/16 11:54 PM  Result Value Ref Range   POCT Fern Test Negative = intact amniotic membranes     MAU Course  Procedures  MDM 0050: D/W Dr. Mindi SlickerBanga ok for dc home.   Assessment and Plan   1. False labor   2. Vaginal discharge during pregnancy, antepartum, third trimester   3. [redacted] weeks gestation of pregnancy    DC home Comfort measures reviewed  3rd Trimester precautions  PTL precautions  Fetal kick counts RX: none  Return to MAU as needed FU with OB as planned  Follow-up Information    Edwinna AreolaBanga, Cecilia Worema, DO Follow up.   Specialty:  Obstetrics and Gynecology Contact information: [address] [place] [address] [phone]            Thressa ShellerHeather Hogan 12/01/2016, 11:58 PM

## 2016-12-02 DIAGNOSIS — N898 Other specified noninflammatory disorders of vagina: Secondary | ICD-10-CM

## 2016-12-02 DIAGNOSIS — Z3A36 36 weeks gestation of pregnancy: Secondary | ICD-10-CM | POA: Diagnosis not present

## 2016-12-02 DIAGNOSIS — O26893 Other specified pregnancy related conditions, third trimester: Secondary | ICD-10-CM

## 2016-12-02 DIAGNOSIS — O479 False labor, unspecified: Secondary | ICD-10-CM

## 2016-12-02 NOTE — Discharge Instructions (Signed)
Third Trimester of Pregnancy The third trimester is from week 28 through week 40 (months 7 through 9). The third trimester is a time when the unborn baby (fetus) is growing rapidly. At the end of the ninth month, the fetus is about 20 inches in length and weighs 6-10 pounds. Body changes during your third trimester Your body will continue to go through many changes during pregnancy. The changes vary from woman to woman. During the third trimester:  Your weight will continue to increase. You can expect to gain 25-35 pounds (11-16 kg) by the end of the pregnancy.  You may begin to get stretch marks on your hips, abdomen, and breasts.  You may urinate more often because the fetus is moving lower into your pelvis and pressing on your bladder.  You may develop or continue to have heartburn. This is caused by increased hormones that slow down muscles in the digestive tract.  You may develop or continue to have constipation because increased hormones slow digestion and cause the muscles that push waste through your intestines to relax.  You may develop hemorrhoids. These are swollen veins (varicose veins) in the rectum that can itch or be painful.  You may develop swollen, bulging veins (varicose veins) in your legs.  You may have increased body aches in the pelvis, back, or thighs. This is due to weight gain and increased hormones that are relaxing your joints.  You may have changes in your hair. These can include thickening of your hair, rapid growth, and changes in texture. Some women also have hair loss during or after pregnancy, or hair that feels dry or thin. Your hair will most likely return to normal after your baby is born.  Your breasts will continue to grow and they will continue to become tender. A yellow fluid (colostrum) may leak from your breasts. This is the first milk you are producing for your baby.  Your belly button may stick out.  You may notice more swelling in your hands,  face, or ankles.  You may have increased tingling or numbness in your hands, arms, and legs. The skin on your belly may also feel numb.  You may feel short of breath because of your expanding uterus.  You may have more problems sleeping. This can be caused by the size of your belly, increased need to urinate, and an increase in your body's metabolism.  You may notice the fetus "dropping," or moving lower in your abdomen (lightening).  You may have increased vaginal discharge.  You may notice your joints feel loose and you may have pain around your pelvic bone.  What to expect at prenatal visits You will have prenatal exams every 2 weeks until week 36. Then you will have weekly prenatal exams. During a routine prenatal visit:  You will be weighed to make sure you and the baby are growing normally.  Your blood pressure will be taken.  Your abdomen will be measured to track your baby's growth.  The fetal heartbeat will be listened to.  Any test results from the previous visit will be discussed.  You may have a cervical check near your due date to see if your cervix has softened or thinned (effaced).  You will be tested for Group B streptococcus. This happens between 35 and 37 weeks.  Your health care provider may ask you:  What your birth plan is.  How you are feeling.  If you are feeling the baby move.  If you have had   any abnormal symptoms, such as leaking fluid, bleeding, severe headaches, or abdominal cramping.  If you are using any tobacco products, including cigarettes, chewing tobacco, and electronic cigarettes.  If you have any questions.  Other tests or screenings that may be performed during your third trimester include:  Blood tests that check for low iron levels (anemia).  Fetal testing to check the health, activity level, and growth of the fetus. Testing is done if you have certain medical conditions or if there are problems during the  pregnancy.  Nonstress test (NST). This test checks the health of your baby to make sure there are no signs of problems, such as the baby not getting enough oxygen. During this test, a belt is placed around your belly. The baby is made to move, and its heart rate is monitored during movement.  What is false labor? False labor is a condition in which you feel small, irregular tightenings of the muscles in the womb (contractions) that usually go away with rest, changing position, or drinking water. These are called Braxton Hicks contractions. Contractions may last for hours, days, or even weeks before true labor sets in. If contractions come at regular intervals, become more frequent, increase in intensity, or become painful, you should see your health care provider. What are the signs of labor?  Abdominal cramps.  Regular contractions that start at 10 minutes apart and become stronger and more frequent with time.  Contractions that start on the top of the uterus and spread down to the lower abdomen and back.  Increased pelvic pressure and dull back pain.  A watery or bloody mucus discharge that comes from the vagina.  Leaking of amniotic fluid. This is also known as your "water breaking." It could be a slow trickle or a gush. Let your health care provider know if it has a color or strange odor. If you have any of these signs, call your health care provider right away, even if it is before your due date. Follow these instructions at home: Medicines  Follow your health care provider's instructions regarding medicine use. Specific medicines may be either safe or unsafe to take during pregnancy.  Take a prenatal vitamin that contains at least 600 micrograms (mcg) of folic acid.  If you develop constipation, try taking a stool softener if your health care provider approves. Eating and drinking  Eat a balanced diet that includes fresh fruits and vegetables, whole grains, good sources of protein  such as meat, eggs, or tofu, and low-fat dairy. Your health care provider will help you determine the amount of weight gain that is right for you.  Avoid raw meat and uncooked cheese. These carry germs that can cause birth defects in the baby.  If you have low calcium intake from food, talk to your health care provider about whether you should take a daily calcium supplement.  Eat four or five small meals rather than three large meals a day.  Limit foods that are high in fat and processed sugars, such as fried and sweet foods.  To prevent constipation: ? Drink enough fluid to keep your urine clear or pale yellow. ? Eat foods that are high in fiber, such as fresh fruits and vegetables, whole grains, and beans. Activity  Exercise only as directed by your health care provider. Most women can continue their usual exercise routine during pregnancy. Try to exercise for 30 minutes at least 5 days a week. Stop exercising if you experience uterine contractions.  Avoid heavy   lifting.  Do not exercise in extreme heat or humidity, or at high altitudes.  Wear low-heel, comfortable shoes.  Practice good posture.  You may continue to have sex unless your health care provider tells you otherwise. Relieving pain and discomfort  Take frequent breaks and rest with your legs elevated if you have leg cramps or low back pain.  Take warm sitz baths to soothe any pain or discomfort caused by hemorrhoids. Use hemorrhoid cream if your health care provider approves.  Wear a good support bra to prevent discomfort from breast tenderness.  If you develop varicose veins: ? Wear support pantyhose or compression stockings as told by your healthcare provider. ? Elevate your feet for 15 minutes, 3-4 times a day. Prenatal care  Write down your questions. Take them to your prenatal visits.  Keep all your prenatal visits as told by your health care provider. This is important. Safety  Wear your seat belt at  all times when driving.  Make a list of emergency phone numbers, including numbers for family, friends, the hospital, and police and fire departments. General instructions  Avoid cat litter boxes and soil used by cats. These carry germs that can cause birth defects in the baby. If you have a cat, ask someone to clean the litter box for you.  Do not travel far distances unless it is absolutely necessary and only with the approval of your health care provider.  Do not use hot tubs, steam rooms, or saunas.  Do not drink alcohol.  Do not use any products that contain nicotine or tobacco, such as cigarettes and e-cigarettes. If you need help quitting, ask your health care provider.  Do not use any medicinal herbs or unprescribed drugs. These chemicals affect the formation and growth of the baby.  Do not douche or use tampons or scented sanitary pads.  Do not cross your legs for long periods of time.  To prepare for the arrival of your baby: ? Take prenatal classes to understand, practice, and ask questions about labor and delivery. ? Make a trial run to the hospital. ? Visit the hospital and tour the maternity area. ? Arrange for maternity or paternity leave through employers. ? Arrange for family and friends to take care of pets while you are in the hospital. ? Purchase a rear-facing car seat and make sure you know how to install it in your car. ? Pack your hospital bag. ? Prepare the baby's nursery. Make sure to remove all pillows and stuffed animals from the baby's crib to prevent suffocation.  Visit your dentist if you have not gone during your pregnancy. Use a soft toothbrush to brush your teeth and be gentle when you floss. Contact a health care provider if:  You are unsure if you are in labor or if your water has broken.  You become dizzy.  You have mild pelvic cramps, pelvic pressure, or nagging pain in your abdominal area.  You have lower back pain.  You have persistent  nausea, vomiting, or diarrhea.  You have an unusual or bad smelling vaginal discharge.  You have pain when you urinate. Get help right away if:  Your water breaks before 37 weeks.  You have regular contractions less than 5 minutes apart before 37 weeks.  You have a fever.  You are leaking fluid from your vagina.  You have spotting or bleeding from your vagina.  You have severe abdominal pain or cramping.  You have rapid weight loss or weight gain.    You have shortness of breath with chest pain.  You notice sudden or extreme swelling of your face, hands, ankles, feet, or legs.  Your baby makes fewer than 10 movements in 2 hours.  You have severe headaches that do not go away when you take medicine.  You have vision changes. Summary  The third trimester is from week 28 through week 40, months 7 through 9. The third trimester is a time when the unborn baby (fetus) is growing rapidly.  During the third trimester, your discomfort may increase as you and your baby continue to gain weight. You may have abdominal, leg, and back pain, sleeping problems, and an increased need to urinate.  During the third trimester your breasts will keep growing and they will continue to become tender. A yellow fluid (colostrum) may leak from your breasts. This is the first milk you are producing for your baby.  False labor is a condition in which you feel small, irregular tightenings of the muscles in the womb (contractions) that eventually go away. These are called Braxton Hicks contractions. Contractions may last for hours, days, or even weeks before true labor sets in.  Signs of labor can include: abdominal cramps; regular contractions that start at 10 minutes apart and become stronger and more frequent with time; watery or bloody mucus discharge that comes from the vagina; increased pelvic pressure and dull back pain; and leaking of amniotic fluid. This information is not intended to replace advice  given to you by your health care provider. Make sure you discuss any questions you have with your health care provider. Document Released: 03/14/2001 Document Revised: 08/26/2015 Document Reviewed: 05/21/2012 Elsevier Interactive Patient Education  2017 [employer]  

## 2016-12-03 LAB — CULTURE, OB URINE

## 2016-12-27 ENCOUNTER — Inpatient Hospital Stay (HOSPITAL_COMMUNITY)
Admission: AD | Admit: 2016-12-27 | Discharge: 2016-12-30 | DRG: 775 | Disposition: A | Discharge status: Home / Self Care | Payer: Medicaid Other | Source: Ambulatory Visit | Attending: Obstetrics and Gynecology | Admitting: Obstetrics and Gynecology

## 2016-12-27 ENCOUNTER — Encounter (HOSPITAL_COMMUNITY): Payer: Self-pay | Admitting: *Deleted

## 2016-12-27 DIAGNOSIS — O134 Gestational [pregnancy-induced] hypertension without significant proteinuria, complicating childbirth: Principal | ICD-10-CM | POA: Diagnosis present

## 2016-12-27 DIAGNOSIS — O99214 Obesity complicating childbirth: Secondary | ICD-10-CM | POA: Diagnosis present

## 2016-12-27 DIAGNOSIS — E669 Obesity, unspecified: Secondary | ICD-10-CM | POA: Diagnosis present

## 2016-12-27 DIAGNOSIS — O26893 Other specified pregnancy related conditions, third trimester: Secondary | ICD-10-CM | POA: Diagnosis present

## 2016-12-27 DIAGNOSIS — Z23 Encounter for immunization: Secondary | ICD-10-CM

## 2016-12-27 DIAGNOSIS — O133 Gestational [pregnancy-induced] hypertension without significant proteinuria, third trimester: Secondary | ICD-10-CM | POA: Diagnosis present

## 2016-12-27 DIAGNOSIS — Z3A39 39 weeks gestation of pregnancy: Secondary | ICD-10-CM

## 2016-12-27 DIAGNOSIS — Z68.41 Body mass index (BMI) pediatric, 5th percentile to less than 85th percentile for age: Secondary | ICD-10-CM | POA: Diagnosis not present

## 2016-12-27 LAB — COMPREHENSIVE METABOLIC PANEL
ALBUMIN: 3 g/dL — AB (ref 3.5–5.0)
ALK PHOS: 186 U/L — AB (ref 38–126)
ALT: 17 U/L (ref 14–54)
ANION GAP: 9 (ref 5–15)
AST: 20 U/L (ref 15–41)
BILIRUBIN TOTAL: 0.7 mg/dL (ref 0.3–1.2)
BUN: 7 mg/dL (ref 6–20)
CALCIUM: 8.8 mg/dL — AB (ref 8.9–10.3)
CO2: 19 mmol/L — ABNORMAL LOW (ref 22–32)
CREATININE: 0.62 mg/dL (ref 0.44–1.00)
Chloride: 108 mmol/L (ref 101–111)
GFR calc Af Amer: >60 mL/min (ref >60)
GFR calc non Af Amer: >60 mL/min (ref >60)
GLUCOSE: 100 mg/dL — AB (ref 65–99)
Potassium: 4.1 mmol/L (ref 3.5–5.1)
Sodium: 136 mmol/L (ref 135–145)
TOTAL PROTEIN: 6.7 g/dL (ref 6.5–8.1)

## 2016-12-27 LAB — CBC
HEMATOCRIT: 35.6 % — AB (ref 36.0–46.0)
HEMOGLOBIN: 12 g/dL (ref 12.0–15.0)
MCH: 26.3 pg (ref 26.0–34.0)
MCHC: 33.7 g/dL (ref 30.0–36.0)
MCV: 78.1 fL (ref 78.0–100.0)
Platelets: 185 10*3/uL (ref 150–400)
RBC: 4.56 MIL/uL (ref 3.87–5.11)
RDW: 13.5 % (ref 11.5–15.5)
WBC: 8.4 10*3/uL (ref 4.0–10.5)

## 2016-12-27 LAB — PROTEIN / CREATININE RATIO, URINE
Creatinine, Urine: 87 mg/dL
Protein Creatinine Ratio: 0.09 mg/mg{Cre} (ref 0.00–0.15)
Total Protein, Urine: 8 mg/dL

## 2016-12-27 LAB — TYPE AND SCREEN
ABO/RH(D): A POS
ANTIBODY SCREEN: NEGATIVE

## 2016-12-27 LAB — OB RESULTS CONSOLE GBS: GBS: NEGATIVE

## 2016-12-27 MED ORDER — ACETAMINOPHEN 325 MG PO TABS
650.0000 mg | ORAL_TABLET | ORAL | Status: DC | PRN
  Administered 2016-12-28 – 2016-12-29 (×2): 650 mg via ORAL
  Filled 2016-12-27 (×2): qty 2

## 2016-12-27 MED ORDER — LACTATED RINGERS IV SOLN: 500.0000 mL | INTRAVENOUS | Status: DC | PRN

## 2016-12-27 MED ORDER — OXYCODONE-ACETAMINOPHEN 5-325 MG PO TABS: 1.0000 | ORAL_TABLET | ORAL | Status: DC | PRN

## 2016-12-27 MED ORDER — EPHEDRINE 5 MG/ML INJ
10.0000 mg | INTRAVENOUS | Status: DC | PRN
  Filled 2016-12-27: qty 2

## 2016-12-27 MED ORDER — LACTATED RINGERS IV SOLN: 500.0000 mL | Freq: Once | INTRAVENOUS | Status: DC

## 2016-12-27 MED ORDER — PHENYLEPHRINE 40 MCG/ML (10ML) SYRINGE FOR IV PUSH (FOR BLOOD PRESSURE SUPPORT)
80.0000 ug | PREFILLED_SYRINGE | INTRAVENOUS | Status: DC | PRN
  Filled 2016-12-27: qty 5

## 2016-12-27 MED ORDER — OXYTOCIN BOLUS FROM INFUSION
500.0000 mL | Freq: Once | INTRAVENOUS | Status: AC
  Administered 2016-12-28: 500 mL via INTRAVENOUS

## 2016-12-27 MED ORDER — ONDANSETRON HCL 4 MG/2ML IJ SOLN
4.0000 mg | Freq: Four times a day (QID) | INTRAMUSCULAR | Status: DC | PRN
  Administered 2016-12-28 (×2): 4 mg via INTRAVENOUS
  Filled 2016-12-27 (×2): qty 2

## 2016-12-27 MED ORDER — OXYTOCIN 40 UNITS IN LACTATED RINGERS INFUSION - SIMPLE MED: 2.5000 [IU]/h | INTRAVENOUS | Status: DC

## 2016-12-27 MED ORDER — SOD CITRATE-CITRIC ACID 500-334 MG/5ML PO SOLN: 30.0000 mL | ORAL | Status: DC | PRN

## 2016-12-27 MED ORDER — FENTANYL 2.5 MCG/ML BUPIVACAINE 1/10 % EPIDURAL INFUSION (WH - ANES)
14.0000 mL/h | INTRAMUSCULAR | Status: DC | PRN
  Administered 2016-12-28: 13 mL/h via EPIDURAL
  Administered 2016-12-28 (×2): 14 mL/h via EPIDURAL
  Filled 2016-12-27 (×4): qty 100

## 2016-12-27 MED ORDER — DIPHENHYDRAMINE HCL 50 MG/ML IJ SOLN: 12.5000 mg | INTRAMUSCULAR | Status: DC | PRN

## 2016-12-27 MED ORDER — PHENYLEPHRINE 40 MCG/ML (10ML) SYRINGE FOR IV PUSH (FOR BLOOD PRESSURE SUPPORT)
80.0000 ug | PREFILLED_SYRINGE | INTRAVENOUS | Status: DC | PRN
  Filled 2016-12-27: qty 10
  Filled 2016-12-27: qty 5

## 2016-12-27 MED ORDER — TERBUTALINE SULFATE 1 MG/ML IJ SOLN
0.2500 mg | Freq: Once | INTRAMUSCULAR | Status: DC | PRN
Start: 2016-12-27 — End: 2016-12-29
  Filled 2016-12-27: qty 1

## 2016-12-27 MED ORDER — OXYCODONE-ACETAMINOPHEN 5-325 MG PO TABS: 2.0000 | ORAL_TABLET | ORAL | Status: DC | PRN

## 2016-12-27 MED ORDER — ACETAMINOPHEN 500 MG PO TABS
1000.0000 mg | ORAL_TABLET | Freq: Once | ORAL | Status: AC
  Administered 2016-12-27: 1000 mg via ORAL
  Filled 2016-12-27: qty 2

## 2016-12-27 MED ORDER — LACTATED RINGERS IV SOLN
INTRAVENOUS | Status: DC
  Administered 2016-12-27 – 2016-12-28 (×4): via INTRAVENOUS

## 2016-12-27 MED ORDER — OXYTOCIN 40 UNITS IN LACTATED RINGERS INFUSION - SIMPLE MED
1.0000 m[IU]/min | INTRAVENOUS | Status: DC
  Administered 2016-12-27: 1 m[IU]/min via INTRAVENOUS
  Filled 2016-12-27: qty 1000

## 2016-12-27 MED ORDER — LIDOCAINE HCL (PF) 1 % IJ SOLN
30.0000 mL | INTRAMUSCULAR | Status: DC | PRN
  Administered 2016-12-28: 30 mL via SUBCUTANEOUS
  Filled 2016-12-27: qty 30

## 2016-12-27 MED ORDER — BUTORPHANOL TARTRATE 1 MG/ML IJ SOLN
1.0000 mg | INTRAMUSCULAR | Status: DC | PRN
  Administered 2016-12-27 – 2016-12-28 (×2): 1 mg via INTRAVENOUS
  Filled 2016-12-27 (×3): qty 1

## 2016-12-27 NOTE — Anesthesia Pain Management Evaluation Note (Signed)
  CRNA Pain Management Visit Note  Patient: Tina Dickson, 21 y.o., female  "Hello I am a member of the anesthesia team at [facility]. We have an anesthesia team available at all times to provide care throughout the hospital, including epidural management and anesthesia for C-section. I don't know your plan for the delivery whether it a natural birth, water birth, IV sedation, nitrous supplementation, doula or epidural, but we want to meet your pain goals."   1.Was your pain managed to your expectations on prior hospitalizations?   No prior hospitalizations  2.What is your expectation for pain management during this hospitalization?     Epidural  3.How can we help you reach that goal? Epidural when desired.  Record the patient's initial score and the patient's pain goal.   Pain: 5  Pain Goal: 8 [facility] wants you to be able to say your pain was always managed very well.  Zayn Selley 12/27/2016

## 2016-12-27 NOTE — MAU Note (Signed)
Urine in lab 

## 2016-12-27 NOTE — MAU Provider Note (Signed)
History     CSN: [id number]  Arrival date and time: 12/27/16 1456   First Provider Initiated Contact with Patient 12/27/16 1611      Chief Complaint  Patient presents with  . Abdominal Pain   Tina Dickson is a 21 y.o. G1P0000 at [redacted]w[redacted]d who presents today for a labor evaluation. She was noted to have high blood pressure on arrival. She is having contractions, and had some mucousy blood. She reports normal fetal movement. She denies any RUQ pain or visual disturbances.She does report a headache at this time. She also reports that at her appointment last week her blood pressure was elevated. She cannot remoever what it was, but reports it was "130/something".    Abdominal Pain  This is a new problem. The current episode started today. The onset quality is gradual. The problem occurs intermittently. The problem has been gradually worsening. The pain is located in the suprapubic region. The quality of the pain is cramping. Associated symptoms include headaches. Pertinent negatives include no nausea or vomiting.     Past Medical History:  Diagnosis Date  . Abdominal pain, recurrent    characterized as crampy, spasmodic pain  . Asthma    as a child- not since 2010  . Chronic constipation     Past Surgical History:  Procedure Laterality Date  . HERNIA REPAIR  1998    Family History  Problem Relation Age of Onset  . Asthma Brother   . Lactose intolerance Mother   . Diabetes Father   . Cancer Maternal Grandmother   . Diabetes Paternal Grandfather   . Irritable bowel syndrome Neg Hx     Social History  Substance Use Topics  . Smoking status: Never Smoker  . Smokeless tobacco: Never Used  . Alcohol use No    Allergies: No Known Allergies  Prescriptions Prior to Admission  Medication Sig Dispense Refill Last Dose  . oxymetazoline (AFRIN) 0.05 % nasal spray Place 1 spray into both nostrils 2 (two) times daily as needed for congestion.   12/26/2016 at Unknown time  .  Prenatal Vit-Fe Fumarate-FA (PRENATAL MULTIVITAMIN) TABS tablet Take 1 tablet by mouth daily at 12 noon.   12/26/2016 at Unknown time  . Doxylamine-Pyridoxine 10-10 MG TBEC Take 10 mg by mouth 2 (two) times daily. Take 2 tablets at bedtime. If symptoms persist, add 1 tab in the AM starting on day 3. If symptoms persist, add 1 tab in the PM starting day 4. 90 tablet 2 prn  . promethazine (PHENERGAN) 25 MG tablet Take 1 tablet (25 mg total) by mouth every 6 (six) hours as needed for nausea or vomiting. 30 tablet 0     Review of Systems  Eyes: Negative for visual disturbance.  Gastrointestinal: Positive for abdominal pain. Negative for nausea and vomiting.  Genitourinary: Positive for pelvic pain. Negative for vaginal bleeding and vaginal discharge.  Neurological: Positive for headaches.   Physical Exam   Blood pressure (!) 151/81, pulse 94, temperature 98.4 F (36.9 C), temperature source Oral, resp. rate 16, last menstrual period 03/15/2016.  Physical Exam  Nursing note and vitals reviewed. Constitutional: She is oriented to person, place, and time. She appears well-developed and well-nourished. No distress.  HENT:  Head: Normocephalic.  Cardiovascular: Normal rate.   Respiratory: Effort normal.  GI: Soft. There is no tenderness. There is no rebound.  Neurological: She is alert and oriented to person, place, and time. She has normal reflexes. She exhibits normal muscle tone (no clonus ).  Skin: Skin is warm and dry.  Psychiatric: She has a normal mood and affect.  FHT: 155, moderate with 15x15 accels, no decles Toco about every 5-7 min contractions    Results for orders placed or performed during the hospital encounter of 12/27/16 (from the past 24 hour(s))  Protein / creatinine ratio, urine     Status: None   Collection Time: 12/27/16  3:10 PM  Result Value Ref Range   Creatinine, Urine 87.00 mg/dL   Total Protein, Urine 8 mg/dL   Protein Creatinine Ratio 0.09 0.00 - 0.15  mg/mg[Cre]  CBC     Status: Abnormal   Collection Time: 12/27/16  4:01 PM  Result Value Ref Range   WBC 8.4 4.0 - 10.5 K/uL   RBC 4.56 3.87 - 5.11 MIL/uL   Hemoglobin 12.0 12.0 - 15.0 g/dL   HCT 78.2 (L) 95.6 - 21.3 %   MCV 78.1 78.0 - 100.0 fL   MCH 26.3 26.0 - 34.0 pg   MCHC 33.7 30.0 - 36.0 g/dL   RDW 08.6 57.8 - 46.9 %   Platelets 185 150 - 400 K/uL  Comprehensive metabolic panel     Status: Abnormal   Collection Time: 12/27/16  4:01 PM  Result Value Ref Range   Sodium 136 135 - 145 mmol/L   Potassium 4.1 3.5 - 5.1 mmol/L   Chloride 108 101 - 111 mmol/L   CO2 19 (L) 22 - 32 mmol/L   Glucose, Bld 100 (H) 65 - 99 mg/dL   BUN 7 6 - 20 mg/dL   Creatinine, Ser 6.29 0.44 - 1.00 mg/dL   Calcium 8.8 (L) 8.9 - 10.3 mg/dL   Total Protein 6.7 6.5 - 8.1 g/dL   Albumin 3.0 (L) 3.5 - 5.0 g/dL   AST 20 15 - 41 U/L   ALT 17 14 - 54 U/L   Alkaline Phosphatase 186 (H) 38 - 126 U/L   Total Bilirubin 0.7 0.3 - 1.2 mg/dL   GFR calc non Af Amer >60 >60 mL/min   GFR calc Af Amer >60 >60 mL/min   Anion gap 9 5 - 15    MAU Course  Procedures  MDM Will give tylenol for HA CBC, CMET, P:Cr pending.  RN updating Dr. Jackelyn Knife, my recommendation is that the patient needs to stay.   Assessment and Plan  39.5 Weeks Hypertension with headache Admit to labor and delivery for IOL.    Thressa Sheller 12/27/2016, 4:23 PM

## 2016-12-27 NOTE — H&P (Signed)
Tina Dickson is a 21 y.o. female, G1P0, EGA 39+ weeks with EDC 9-28 presenting for ctx.  On eval in MAU, having ctx q 2-4 min, VE 1 cm, no change over several hours.  However, she also c/o mild headache, BP 130-150/80-90.  Normal PIH labs.  Prenatal care essentially uncomplicated.  OB History    Gravida Para Term Preterm AB Living   1 0 0 0 0 0   SAB TAB Ectopic Multiple Live Births   0 0 0 0       Past Medical History:  Diagnosis Date  . Abdominal pain, recurrent    characterized as crampy, spasmodic pain  . Asthma    as a child- not since 2010  . Chronic constipation    Past Surgical History:  Procedure Laterality Date  . HERNIA REPAIR  1998   Family History: family history includes Asthma in her brother; Cancer in her maternal grandmother; Diabetes in her father and paternal grandfather; Lactose intolerance in her mother. Social History:  reports that she has never smoked. She has never used smokeless tobacco. She reports that she does not drink alcohol or use drugs.     Maternal Diabetes: No Genetic Screening: Normal Maternal Ultrasounds/Referrals: Normal Fetal Ultrasounds or other Referrals:  None Maternal Substance Abuse:  No Significant Maternal Medications:  None Significant Maternal Lab Results:  Lab values include: Group B Strep negative Other Comments:  None  Review of Systems  Respiratory: Negative.   Cardiovascular: Negative.    Maternal Medical History:  Reason for admission: Contractions.   Contractions: Frequency: regular.   Perceived severity is moderate.    Fetal activity: Perceived fetal activity is normal.    Prenatal complications: no prenatal complications Prenatal Complications - Diabetes: none.    Dilation: 1 Effacement (%): 50 Station: -3 Exam by:: A. Gagliardo, RN Blood pressure (!) 148/96, pulse 98, temperature 98.4 F (36.9 C), temperature source Oral, resp. rate 16, last menstrual period 03/15/2016. Maternal Exam:  Uterine  Assessment: Contraction strength is moderate.  Contraction frequency is regular.   Abdomen: Patient reports no abdominal tenderness. Estimated fetal weight is 7 lbs.   Fetal presentation: vertex  Introitus: Normal vulva. Normal vagina.  Amniotic fluid character: not assessed.  Pelvis: adequate for delivery.   Cervix: Cervix evaluated by digital exam.     Fetal Exam Fetal Monitor Review: Mode: ultrasound.   Baseline rate: 130.  Variability: moderate (6-25 bpm).   Pattern: accelerations present and no decelerations.    Fetal State Assessment: Category I - tracings are normal.     Physical Exam  Vitals reviewed. Constitutional: She appears well-developed and well-nourished.  Cardiovascular: Normal rate and regular rhythm.   Respiratory: Effort normal. No respiratory distress.  GI: Soft.    Prenatal labs: ABO, Rh: --/--/A POS (02/05 1044) Antibody:  neg Rubella:   Immune RPR: Non Reactive (02/05 1043)  HBsAg:  Neg  HIV:  NR  GBS: Negative (09/26 0000)   Assessment/Plan: IUP at 39+ weeks in latent labor with PIH, normal labs, mild headache.  Will admit, augment labor if needed, monitor BP and treat prn.   Leighton Roach Ariaunna Longsworth 12/27/2016, 5:38 PM

## 2016-12-27 NOTE — MAU Note (Signed)
Pt stared feeling contractions this morning around 0600 that were 10 min apart and they have gotten stronger and closer together.  Lost some mucus that was brown.  Denies LOF.

## 2016-12-28 ENCOUNTER — Inpatient Hospital Stay (HOSPITAL_COMMUNITY): Payer: Medicaid Other | Admitting: Anesthesiology

## 2016-12-28 ENCOUNTER — Encounter (HOSPITAL_COMMUNITY): Payer: Self-pay | Admitting: Anesthesiology

## 2016-12-28 LAB — RPR: RPR: NONREACTIVE

## 2016-12-28 MED ORDER — LIDOCAINE HCL (PF) 1 % IJ SOLN
INTRAMUSCULAR | Status: DC | PRN
  Administered 2016-12-28 (×2): 4 mL via EPIDURAL

## 2016-12-28 MED ORDER — SODIUM CHLORIDE 0.9 % IV SOLN
3.0000 g | Freq: Once | INTRAVENOUS | Status: AC
  Administered 2016-12-28: 3 g via INTRAVENOUS
  Filled 2016-12-28: qty 3

## 2016-12-28 MED ORDER — ONDANSETRON HCL 4 MG PO TABS: 4.0000 mg | ORAL_TABLET | ORAL | Status: DC | PRN

## 2016-12-28 MED ORDER — ONDANSETRON HCL 4 MG/2ML IJ SOLN: 4.0000 mg | INTRAMUSCULAR | Status: DC | PRN

## 2016-12-28 MED ORDER — IBUPROFEN 600 MG PO TABS
600.0000 mg | ORAL_TABLET | Freq: Four times a day (QID) | ORAL | Status: DC
  Administered 2016-12-28 – 2016-12-30 (×7): 600 mg via ORAL
  Filled 2016-12-28 (×7): qty 1

## 2016-12-28 NOTE — Progress Notes (Signed)
Patient ID: Tina Dickson, female   DOB: 07-16-1995, 21 y.o.   MRN: [id number]  Pt with no complaints Temp 100.3  EFM - cat 1, 150 TOCO  - ctxs q SVE  9.5/100/-1  A/P:Prime at term, progressing well        SROM x 15hrs - will start on unasyn         Recheck prn          Pit now at         Anticipate svd

## 2016-12-28 NOTE — Progress Notes (Signed)
Patient ID: Tina Dickson, female   DOB: December 06, 1995, 21 y.o.   MRN: [id number] Pt doing well. Comfortable with epidural- reports pressure in her buttocks. +Fms. No HA or blurry vision VS - 126-135/63-77 EFM - cat 1, 150 TOCO - ctxs q SVE 4-5/90/-2  A/P: Prime at term progressing well on pitocin         Increase pitocin to 2/77mus         Expectant mgmt         Anticipate svd

## 2016-12-28 NOTE — Progress Notes (Signed)
Patient ID: Tina Dickson, female   DOB: 1995-09-03, 21 y.o.   MRN: [id number] Pt doing well. Still comfortable with no complaints. +FM VS 126-133/66-72 EFM - cat 1, 150 TOCO - ctxs q 3-38mins SVE 8/90/-1  A/P: Prime at term progressing well on of pit         Pitocin was decreased due to cat 2 strip with improvement noted; continue per protocol          IUPC placed          Anticipate svd

## 2016-12-28 NOTE — Anesthesia Procedure Notes (Signed)
Epidural Patient location during procedure: OB Start time: 12/28/2016 2:53 AM  Staffing Anesthesiologist: Mal Amabile Performed: anesthesiologist   Preanesthetic Checklist Completed: patient identified, site marked, surgical consent, pre-op evaluation, timeout performed, IV checked, risks and benefits discussed and monitors and equipment checked  Epidural Patient position: sitting Prep: site prepped and draped and DuraPrep Patient monitoring: continuous pulse ox and blood pressure Approach: midline Location: L3-L4 Injection technique: LOR air  Needle:  Needle type: Tuohy  Needle gauge: 17 G Needle length: 9 cm and 9 Needle insertion depth: 8 cm Catheter type: closed end flexible Catheter size: 19 Gauge Catheter at skin depth: 13 cm Test dose: negative and Other  Assessment Events: blood not aspirated, injection not painful, no injection resistance, negative IV test and no paresthesia  Additional Notes Patient identified. Risks and benefits discussed including failed block, incomplete  Pain control, post dural puncture headache, nerve damage, paralysis, blood pressure Changes, nausea, vomiting, reactions to medications-both toxic and allergic and post Partum back pain. All questions were answered. Patient expressed understanding and wished to proceed. Sterile technique was used throughout procedure. Epidural site was Dressed with sterile barrier dressing. No paresthesias, signs of intravascular injection Or signs of intrathecal spread were encountered.  Patient was more comfortable after the epidural was dosed. Please see RN's note for documentation of vital signs and FHR which are stable.

## 2016-12-28 NOTE — Anesthesia Preprocedure Evaluation (Signed)
Anesthesia Evaluation  Patient identified by MRN, date of birth, ID band Patient awake    Reviewed: Allergy & Precautions, Patient's Chart, lab work & pertinent test results  Airway Mallampati: III  TM Distance: >3 FB Neck ROM: Full    Dental no notable dental hx. (+) Teeth Intact   Pulmonary asthma ,    Pulmonary exam normal breath sounds clear to auscultation       Cardiovascular hypertension, Normal cardiovascular exam Rhythm:Regular Rate:Normal     Neuro/Psych negative neurological ROS  negative psych ROS   GI/Hepatic Neg liver ROS, GERD  ,  Endo/Other  Obesity  Renal/GU negative Renal ROS  negative genitourinary   Musculoskeletal negative musculoskeletal ROS (+)   Abdominal (+) + obese,   Peds  Hematology negative hematology ROS (+)   Anesthesia Other Findings   Reproductive/Obstetrics (+) Pregnancy Gestational HTN                             Anesthesia Physical Anesthesia Plan  ASA: II  Anesthesia Plan: Epidural   Post-op Pain Management:    Induction:   PONV Risk Score and Plan:   Airway Management Planned: Natural Airway  Additional Equipment:   Intra-op Plan:   Post-operative Plan:   Informed Consent: I have reviewed the patients History and Physical, chart, labs and discussed the procedure including the risks, benefits and alternatives for the proposed anesthesia with the patient or authorized representative who has indicated his/her understanding and acceptance.     Plan Discussed with: Anesthesiologist  Anesthesia Plan Comments:         Anesthesia Quick Evaluation

## 2016-12-29 ENCOUNTER — Encounter (HOSPITAL_COMMUNITY): Payer: Self-pay | Admitting: *Deleted

## 2016-12-29 LAB — CBC
HCT: 22.5 % — ABNORMAL LOW (ref 36.0–46.0)
Hemoglobin: 7.8 g/dL — ABNORMAL LOW (ref 12.0–15.0)
MCH: 27.1 pg (ref 26.0–34.0)
MCHC: 34.7 g/dL (ref 30.0–36.0)
MCV: 78.1 fL (ref 78.0–100.0)
PLATELETS: 138 10*3/uL — AB (ref 150–400)
RBC: 2.88 MIL/uL — AB (ref 3.87–5.11)
RDW: 13.7 % (ref 11.5–15.5)
WBC: 14.7 10*3/uL — AB (ref 4.0–10.5)

## 2016-12-29 MED ORDER — OXYCODONE HCL 5 MG PO TABS: 5.0000 mg | ORAL_TABLET | ORAL | Status: DC | PRN

## 2016-12-29 MED ORDER — COCONUT OIL OIL: 1.0000 "application " | TOPICAL_OIL | Status: DC | PRN

## 2016-12-29 MED ORDER — TETANUS-DIPHTH-ACELL PERTUSSIS 5-2.5-18.5 LF-MCG/0.5 IM SUSP: 0.5000 mL | Freq: Once | INTRAMUSCULAR | Status: DC

## 2016-12-29 MED ORDER — ACETAMINOPHEN 325 MG PO TABS
650.0000 mg | ORAL_TABLET | ORAL | Status: DC | PRN
  Administered 2016-12-29: 650 mg via ORAL
  Filled 2016-12-29: qty 2

## 2016-12-29 MED ORDER — DIPHENHYDRAMINE HCL 25 MG PO CAPS: 25.0000 mg | ORAL_CAPSULE | Freq: Four times a day (QID) | ORAL | Status: DC | PRN

## 2016-12-29 MED ORDER — PRENATAL MULTIVITAMIN CH
1.0000 | ORAL_TABLET | Freq: Every day | ORAL | Status: DC
  Administered 2016-12-29 – 2016-12-30 (×2): 1 via ORAL
  Filled 2016-12-29 (×2): qty 1

## 2016-12-29 MED ORDER — SENNOSIDES-DOCUSATE SODIUM 8.6-50 MG PO TABS
2.0000 | ORAL_TABLET | ORAL | Status: DC
  Administered 2016-12-30: 2 via ORAL
  Filled 2016-12-29: qty 2

## 2016-12-29 MED ORDER — INFLUENZA VAC SPLIT QUAD 0.5 ML IM SUSY
0.5000 mL | PREFILLED_SYRINGE | INTRAMUSCULAR | Status: AC
  Administered 2016-12-29: 0.5 mL via INTRAMUSCULAR
  Filled 2016-12-29: qty 0.5

## 2016-12-29 MED ORDER — ZOLPIDEM TARTRATE 5 MG PO TABS: 5.0000 mg | ORAL_TABLET | Freq: Every evening | ORAL | Status: DC | PRN

## 2016-12-29 MED ORDER — SIMETHICONE 80 MG PO CHEW: 80.0000 mg | CHEWABLE_TABLET | ORAL | Status: DC | PRN

## 2016-12-29 MED ORDER — WITCH HAZEL-GLYCERIN EX PADS: 1.0000 "application " | MEDICATED_PAD | CUTANEOUS | Status: DC | PRN

## 2016-12-29 MED ORDER — DIBUCAINE 1 % RE OINT: 1.0000 "application " | TOPICAL_OINTMENT | RECTAL | Status: DC | PRN

## 2016-12-29 MED ORDER — BENZOCAINE-MENTHOL 20-0.5 % EX AERO
1.0000 "application " | INHALATION_SPRAY | CUTANEOUS | Status: DC | PRN
  Administered 2016-12-30: 1 via TOPICAL
  Filled 2016-12-29 (×2): qty 56

## 2016-12-29 MED ORDER — OXYCODONE HCL 5 MG PO TABS
10.0000 mg | ORAL_TABLET | ORAL | Status: DC | PRN
Start: 2016-12-29 — End: 2016-12-30

## 2016-12-29 NOTE — Plan of Care (Signed)
Problem: Nutritional: Goal: Mothers verbalization of comfort with breastfeeding process will improve Outcome: Progressing Discussed breastfeeding with pt.  Pt denies nipple soreness.  No bruising or redness noted on assessment.  Baby woken by stripping down to diaper and placing skin to skin.  Waking techniques discussed with pt.  Pt reports that she prefers the football position.  Assisted getting baby into that position and placing pillows to aid with positioning.  Baby latched after multiple attempts.  Baby opens the mouth wide but closes quickly and mother slowly latches baby and this leaves baby on the tip of the nipple.  Discussed and demonstrated obtaining a quick latch.  Discussed and demonstrated a deep latch, flanged lips, and what a swallow sounds and looks like.  Pt verbalized understanding of all teaching.

## 2016-12-29 NOTE — Lactation Note (Signed)
This note was copied from a baby's chart. Lactation Consultation Note  Patient Name: Tina Dickson ZOXWR'U Date: 12/29/2016 Reason for consult: Initial assessment   P1, Baby 15 hours old.  Baby recently latched well on L side but wanted assistance sustaining latch on R side. Reviewed hand expression.  Drops expressed. Suggest mother prepump before latching on R side. Assisted w/ latching in both football and cross cradle.  Using compression and with FOB's assistance mother was able to sustain latch. Mom encouraged to feed baby 8-12 times/24 hours and with feeding cues.  Discussed basics. Mother is working on positioning, compression and depth and will need review. Mom made aware of O/P services, breastfeeding support groups, community resources, and our phone # for post-discharge questions.      Maternal Data Has patient been taught Hand Expression?: Yes Does the patient have breastfeeding experience prior to this delivery?: No  Feeding Feeding Type: Breast Fed Length of feed: 30 min (off and on)  LATCH Score Latch: Grasps breast easily, tongue down, lips flanged, rhythmical sucking. (more difficulty onf R side)  Audible Swallowing: A few with stimulation  Type of Nipple: Everted at rest and after stimulation  Comfort (Breast/Nipple): Soft / non-tender  Hold (Positioning): Assistance needed to correctly position infant at breast and maintain latch.  LATCH Score: 8  Interventions    Lactation Tools Discussed/Used     Consult Status Consult Status: Follow-up Date: 12/30/16 Follow-up type: In-patient    [name] [facility] 12/29/2016, 11:52 AM

## 2016-12-29 NOTE — Progress Notes (Signed)
Post Partum Day 1 Subjective: no complaints, up ad lib, voiding, tolerating PO and nl lochia, pain controlled  Objective: Blood pressure 112/68, pulse 90, temperature 98 F (36.7 C), temperature source Oral, resp. rate 16, height  (1.6 m), weight 94.8 kg (209 lb), last menstrual period 03/15/2016, unknown if currently breastfeeding.  Physical Exam:  General: alert and no distress Lochia: appropriate Uterine Fundus: firm   Recent Labs  12/27/16 1601 12/29/16 0555  HGB 12.0 7.8*  HCT 35.6* 22.5*    Assessment/Plan: Plan for discharge tomorrow, Breastfeeding and Lactation consult.  Routine PP care.     LOS: 2 days   Ellisa Devivo Bovard-Stuckert 12/29/2016, 7:37 AM

## 2016-12-29 NOTE — Anesthesia Postprocedure Evaluation (Signed)
Anesthesia Post Note  Patient: Tina Dickson  Procedure(s) Performed: * No procedures listed *     Patient location during evaluation: Mother Baby Anesthesia Type: Epidural Level of consciousness: awake Pain management: satisfactory to patient Vital Signs Assessment: post-procedure vital signs reviewed and stable Respiratory status: spontaneous breathing Cardiovascular status: stable Postop Assessment: no headache, no backache, patient able to bend at knees, no apparent nausea or vomiting and adequate PO intake Anesthetic complications: no    Last Vitals:  Vitals:   12/29/16 0526 12/29/16 0803  BP: 112/68   Pulse: 90 86  Resp: 16 20  Temp: 36.7 C 36.5 C    Last Pain:  Vitals:   12/29/16 0805  TempSrc:   PainSc: 1    Pain Goal: Patients Stated Pain Goal: 7 (12/28/16 0025)               Cephus Shelling

## 2016-12-30 MED ORDER — PRENATAL MULTIVITAMIN CH
1.0000 | ORAL_TABLET | Freq: Every day | ORAL | 3 refills | Status: DC
Start: 2016-12-30 — End: 2017-12-24

## 2016-12-30 MED ORDER — IBUPROFEN 800 MG PO TABS: 800.0000 mg | ORAL_TABLET | Freq: Three times a day (TID) | ORAL | 1 refills | Status: DC | PRN

## 2016-12-30 NOTE — Progress Notes (Signed)
Post Partum Day 2 Subjective: no complaints, tolerating PO and nl lochia, pain controlled  Objective: Blood pressure 124/68, pulse 99, temperature 98.5 F (36.9 C), temperature source Oral, resp. rate 18, height  (1.6 m), weight 94.8 kg (209 lb), last menstrual period 03/15/2016, unknown if currently breastfeeding.  Physical Exam:  General: alert and no distress Lochia: appropriate Uterine Fundus: firm   Recent Labs  12/27/16 1601 12/29/16 0555  HGB 12.0 7.8*  HCT 35.6* 22.5*    Assessment/Plan: Discharge home, Breastfeeding and Lactation consult.  Routine care.  D/C with motrin and PNV.  F/u 6 weeks.     LOS: 3 days   Tina Dickson 12/30/2016, 8:25 AM

## 2016-12-30 NOTE — Discharge Summary (Signed)
OB Discharge Summary     Patient Name: Tina Dickson DOB: March 02, 1996 MRN: [id number]  Date of admission: 12/27/2016 Delivering MD: Sherian Rein   Date of discharge: 12/30/2016  Admitting diagnosis: 39.5wks CTX loss mucus plug Intrauterine pregnancy: [redacted]w[redacted]d     Secondary diagnosis:  Active Problems:   Gestational hypertension w/o significant proteinuria in 3rd trimester   SVD (spontaneous vaginal delivery)   Postpartum care following vaginal delivery  Additional problems: N/A     Discharge diagnosis: Term Pregnancy Delivered                                                                                                Post partum procedures:N/A  Augmentation: Pitocin  Complications: None  Hospital course:  Induction of Labor With Vaginal Delivery   21 y.o. yo G1P1000 at [redacted]w[redacted]d was admitted to the hospital 12/27/2016 for induction of labor.  Indication for induction: Gestational hypertension.  Patient had an uncomplicated labor course as follows: Membrane Rupture Time/Date: 2:25 AM ,12/28/2016   Intrapartum Procedures: Episiotomy: None [1]                                         Lacerations:  1st degree [2];Perineal [11];Labial [10]  Patient had delivery of a Viable infant.  Information for the patient's newborn:  Preslie, Depasquale [[id number]]  Delivery Method: Vaginal, Spontaneous Delivery (Filed from Delivery Summary)   12/28/2016  Details of delivery can be found in separate delivery note.  Patient had a routine postpartum course. Patient is discharged home 12/30/16.  Physical exam  Vitals:   12/29/16 0526 12/29/16 0803 12/29/16 1300 12/29/16 1809  BP: 112/68 126/72 110/72 124/68  Pulse: 90 86 70 99  Resp: 16 20 (!) 24 18  Temp: 98 F (36.7 C) 97.7 F (36.5 C) 97.9 F (36.6 C) 98.5 F (36.9 C)  TempSrc: Oral Oral Oral Oral  Weight:      Height:       General: alert and no distress Lochia: appropriate Uterine Fundus: firm  Labs: Lab Results   Component Value Date   WBC 14.7 (H) 12/29/2016   HGB 7.8 (L) 12/29/2016   HCT 22.5 (L) 12/29/2016   MCV 78.1 12/29/2016   PLT 138 (L) 12/29/2016   CMP Latest Ref Rng & Units 12/27/2016  Glucose 65 - 99 mg/dL 629(B)  BUN 6 - 20 mg/dL 7  Creatinine 2.84 - 1.32 mg/dL 4.40  Sodium 102 - 725 mmol/L 136  Potassium 3.5 - 5.1 mmol/L 4.1  Chloride 101 - 111 mmol/L 108  CO2 22 - 32 mmol/L 19(L)  Calcium 8.9 - 10.3 mg/dL 3.6(U)  Total Protein 6.5 - 8.1 g/dL 6.7  Total Bilirubin 0.3 - 1.2 mg/dL 0.7  Alkaline Phos 38 - 126 U/L 186(H)  AST 15 - 41 U/L 20  ALT 14 - 54 U/L 17    Discharge instruction: per After Visit Summary and "Baby and Me Booklet".  After visit meds:  Allergies as of 12/30/2016  No Known Allergies     Medication List    STOP taking these medications   Doxylamine-Pyridoxine 10-10 MG Tbec     TAKE these medications   ibuprofen 800 MG tablet Commonly known as:  ADVIL,MOTRIN Take 1 tablet (800 mg total) by mouth every 8 (eight) hours as needed.   oxymetazoline 0.05 % nasal spray Commonly known as:  AFRIN Place 1 spray into both nostrils 2 (two) times daily as needed for congestion.   prenatal multivitamin Tabs tablet Take 1 tablet by mouth daily at 12 noon.   promethazine 25 MG tablet Commonly known as:  PHENERGAN Take 1 tablet (25 mg total) by mouth every 6 (six) hours as needed for nausea or vomiting.            Discharge Care Instructions        Start     Ordered   12/30/16 0000  ibuprofen (ADVIL,MOTRIN) 800 MG tablet  Every 8 hours PRN     12/30/16 0829   12/30/16 0000  Prenatal Vit-Fe Fumarate-FA (PRENATAL MULTIVITAMIN) TABS tablet  Daily     12/30/16 0829   12/30/16 0000  Increase activity slowly     12/30/16 0830   12/30/16 0000  Diet - low sodium heart healthy     12/30/16 0830   12/30/16 0000  Discharge instructions    Comments:  Call [phone] with questions or problems   12/30/16 0830   12/30/16 0000  May walk up steps      12/30/16 0830   12/30/16 0000  May shower / Bathe     12/30/16 0830   12/30/16 0000  Call MD for:  persistant nausea and vomiting     12/30/16 0830   12/30/16 0000  Call MD for:  severe uncontrolled pain     12/30/16 0830   12/27/16 0000  OB RESULT CONSOLE Group B Strep    Comments:  This external order was created through the Results Console.   12/27/16 1711   12/27/16 0000  OB RESULTS CONSOLE GC/Chlamydia    Comments:  This external order was created through the Results Console.   12/27/16 1951   12/27/16 0000  OB RESULTS CONSOLE Rubella Antibody    Comments:  This external order was created through the Results Console.    12/27/16 1951   12/27/16 0000  OB RESULTS CONSOLE Hepatitis B surface antigen    Comments:  This external order was created through the Results Console.    12/27/16 1951      Diet: routine diet  Activity: Advance as tolerated. Pelvic rest for 6 weeks.   Outpatient follow up:6 weeks Follow up Appt:No future appointments. Follow up Visit:No Follow-up on file.  Postpartum contraception: Undecided  Newborn Data: Live born female  Birth Weight: 7 lb 3.7 oz (3280 g) APGAR: 8, 9  Baby Feeding: Breast Disposition:home with mother   12/30/2016 Sherian Rein, MD

## 2016-12-30 NOTE — Lactation Note (Signed)
This note was copied from a baby's chart. Lactation Consultation Note  Patient Name: Girl Kadee Philyaw ZOXWR'U Date: 12/30/2016 Reason for consult: Follow-up assessment    With this mom and term baby, now 71 hours old. Mom has easily expressed coosotrum, and states breast feeding going 'much better". She is now able to latch baby to both breasts.  Basic breast feeding teaching reviewed, as well as engorgement care. [facility] clinic phone number left for mom to call, if she has questions/concerns for lactation.    Maternal Data    Feeding Feeding Type: Breast Fed Length of feed: 5 min  LATCH Score    Audible Swallowing:  (good flow of colostrum with hand expression)  Type of Nipple: Everted at rest and after stimulation (short shaft)           Interventions Interventions: Breast feeding basics reviewed;Hand express;Expressed milk  Lactation Tools Discussed/Used     Consult Status Consult Status: Complete Follow-up type: Call as needed    Alfred Levins 12/30/2016, 10:36 AM

## 2016-12-31 ENCOUNTER — Ambulatory Visit: Payer: Self-pay

## 2016-12-31 NOTE — Lactation Note (Signed)
This note was copied from a baby's chart. Lactation Consultation Note  Patient Name: Tina Dickson ZOXWR'U Date: 12/31/2016   Baby 62 hours old. Parents preparing car seat for D/C. Mom states that breastfeeding is going well and she has no needs at this time. Mom aware of OP/BFSG and LC phone line assistance after D/C.   Maternal Data    Feeding Feeding Type: Breast Fed Length of feed: 15 min  LATCH Score                   Interventions    Lactation Tools Discussed/Used     Consult Status      Sherlyn Hay 12/31/2016, 11:07 AM

## 2017-01-17 ENCOUNTER — Encounter: Payer: Self-pay | Admitting: Pediatrics

## 2017-04-04 ENCOUNTER — Other Ambulatory Visit: Payer: Self-pay

## 2017-04-04 ENCOUNTER — Ambulatory Visit (HOSPITAL_COMMUNITY)
Admission: EM | Admit: 2017-04-04 | Discharge: 2017-04-04 | Disposition: A | Discharge status: Home / Self Care | Payer: Medicaid Other | Attending: Family Medicine | Admitting: Family Medicine

## 2017-04-04 ENCOUNTER — Encounter (HOSPITAL_COMMUNITY): Payer: Self-pay | Admitting: Emergency Medicine

## 2017-04-04 DIAGNOSIS — B9789 Other viral agents as the cause of diseases classified elsewhere: Secondary | ICD-10-CM | POA: Diagnosis not present

## 2017-04-04 DIAGNOSIS — J028 Acute pharyngitis due to other specified organisms: Secondary | ICD-10-CM

## 2017-04-04 DIAGNOSIS — J069 Acute upper respiratory infection, unspecified: Secondary | ICD-10-CM

## 2017-04-04 NOTE — Discharge Instructions (Signed)
Follow up with your primary care doctor or here if you are not seeing improvement of your symptoms over the next several days.  Caring for yourself: Get plenty of rest. Drink plenty of fluids, enough so that your urine is light yellow or clear like water. If you have kidney, heart, or liver disease and have to limit fluids, talk with your doctor before you increase the amount of fluids you drink. Take an over-the-counter pain medicine if needed, such as acetaminophen (Tylenol), ibuprofen (Advil, Motrin), or naproxen (Aleve), to relieve fever, headache, and muscle aches. Read and follow all instructions on the label. No one younger than 20 should take aspirin. It has been linked to Reye syndrome, a serious illness. Before you use over the counter cough and cold medicines, check the label. These medicines may not be safe for children younger than age 166 or for people with certain health problems. If the skin around your nose and lips becomes sore, put some petroleum jelly on the area.  Avoid spreading the flu: Wash your hands regularly, and keep your hands away from your face.  Stay home from school, work, and other public places until you are feeling better and your fever has been gone for at least 24 hours. The fever needs to have gone away on its own without the help of medicine.

## 2017-04-04 NOTE — ED Triage Notes (Signed)
Pt reports nasal congestion and sore throat since yesterday. 

## 2017-04-04 NOTE — ED Provider Notes (Signed)
[place]   [id number] 04/04/17 Arrival Time: 3710  ASSESSMENT & PLAN:  1. Viral URI with cough   2. Sore throat (viral)    OTC symptom care as needed. Ensure adequate fluid intake and rest. May f/u with PCP or here as needed.  Reviewed expectations re: course of current medical issues. Questions answered. Outlined signs and symptoms indicating need for more acute intervention. Patient verbalized understanding. After Visit Summary given.   SUBJECTIVE: History from: patient.  Lannah Terance Hart is a 22 y.o. female who presents with complaint of nasal congestion, post-nasal drainage, and a persistent dry cough. Mild ST. Onset abrupt, approximately 1-2 days ago. Overall fatigued with body aches. SOB: none. Wheezing: none. Fever: no. Overall normal PO intake without n/v. Sick contacts: no. OTC treatment: none.  Social History   Tobacco Use  Smoking Status Never Smoker  Smokeless Tobacco Never Used   Immunization History  Administered Date(s) Administered  . DTaP 05/20/1996, 07/15/1996, 09/09/1996, 03/23/1997, 04/10/2000  . HPV Quadrivalent 04/08/2007, 06/24/2007, 07/09/2008  . Hepatitis A 04/08/2007, 07/09/2008  . Hepatitis B 13-Feb-1996, 05/01/1996, 09/16/1996  . HiB (PRP-OMP) 05/20/1996, 07/15/1996, 09/09/1996, 03/23/1997  . IPV 05/20/1996, 07/15/1996, 03/23/1997, 04/10/2000  . Influenza,Quad,Nasal, Live 04/15/2013  . Influenza,inj,Quad PF,6+ Mos 01/13/2015, 12/29/2016  . Influenza,inj,quad, With Preservative 04/16/2014  . Influenza-Unspecified 02/15/2007, 04/25/2012  . MMR 03/23/1997, 04/10/2000  . Meningococcal Conjugate 07/09/2008, 08/04/2013  . PPD Test 06/10/2013  . Td 04/08/2007  . Tdap 04/08/2007  . Varicella 05/08/1997, 04/08/2007   ROS: As per HPI.   OBJECTIVE:  Vitals:   04/04/17 1437  BP: 118/67  Pulse: 63  Temp: 98.7 F (37.1 C)  TempSrc: Oral  SpO2: 100%     General appearance: alert; appears fatigued HEENT: nasal congestion;  clear runny nose; throat irritation secondary to post-nasal drainage Neck: supple without LAD Lungs: clear to auscultation bilaterally; cough: mild; no respiratory distress Skin: warm and dry Psychological: alert and cooperative; normal mood and affect   No Known Allergies  Past Medical History:  Diagnosis Date  . Abdominal pain, recurrent    characterized as crampy, spasmodic pain  . Asthma    as a child- not since 2010  . Chronic constipation    Family History  Problem Relation Age of Onset  . Asthma Brother   . Lactose intolerance Mother   . Diabetes Father   . Cancer Maternal Grandmother   . Diabetes Paternal Grandfather   . Irritable bowel syndrome Neg Hx    Social History   Socioeconomic History  . Marital status: Married    Spouse name: Not on file  . Number of children: Not on file  . Years of education: Not on file  . Highest education level: Not on file  Social Needs  . Financial resource strain: Not on file  . Food insecurity - worry: Not on file  . Food insecurity - inability: Not on file  . Transportation needs - medical: Not on file  . Transportation needs - non-medical: Not on file  Occupational History  . Not on file  Tobacco Use  . Smoking status: Never Smoker  . Smokeless tobacco: Never Used  Substance and Sexual Activity  . Alcohol use: No  . Drug use: No  . Sexual activity: No    Birth control/protection: None  Other Topics Concern  . Not on file  Social History Narrative   Lives with Mom and brother together with grandmother and her husband.  Vanessa Kick, MD 04/07/17 1052

## 2017-10-26 ENCOUNTER — Encounter (HOSPITAL_COMMUNITY): Payer: Self-pay

## 2017-10-26 ENCOUNTER — Ambulatory Visit (HOSPITAL_COMMUNITY)
Admission: EM | Admit: 2017-10-26 | Discharge: 2017-10-26 | Disposition: A | Discharge status: Home / Self Care | Payer: Medicaid Other | Attending: Internal Medicine | Admitting: Internal Medicine

## 2017-10-26 DIAGNOSIS — R51 Headache: Secondary | ICD-10-CM | POA: Diagnosis not present

## 2017-10-26 DIAGNOSIS — S40861A Insect bite (nonvenomous) of right upper arm, initial encounter: Secondary | ICD-10-CM | POA: Diagnosis not present

## 2017-10-26 DIAGNOSIS — T783XXA Angioneurotic edema, initial encounter: Secondary | ICD-10-CM

## 2017-10-26 DIAGNOSIS — S40862A Insect bite (nonvenomous) of left upper arm, initial encounter: Secondary | ICD-10-CM | POA: Diagnosis not present

## 2017-10-26 MED ORDER — METHYLPREDNISOLONE SODIUM SUCC 125 MG IJ SOLR
125.0000 mg | Freq: Once | INTRAMUSCULAR | Status: AC
  Administered 2017-10-26: 125 mg via INTRAMUSCULAR

## 2017-10-26 MED ORDER — CETIRIZINE HCL 10 MG PO TABS: 10.0000 mg | ORAL_TABLET | Freq: Every day | ORAL | 0 refills | Status: DC

## 2017-10-26 MED ORDER — PREDNISONE 20 MG PO TABS: 20.0000 mg | ORAL_TABLET | Freq: Every day | ORAL | 0 refills | Status: DC

## 2017-10-26 MED ORDER — METHYLPREDNISOLONE SODIUM SUCC 125 MG IJ SOLR
INTRAMUSCULAR | Status: AC
  Filled 2017-10-26: qty 2

## 2017-10-26 NOTE — Discharge Instructions (Signed)
Injection of Solu-Medrol, a steroid, was given today.  Prescriptions for prednisone, a steroid, and cetirizine, an antihistamine, were sent to the pharmacy.  You can take additional Benadryl (diphenhydramine) OTC as needed to help decrease swelling and discomfort.  Ice for 5 to 10 minutes several times daily may also help decrease swelling and discomfort.  Anticipate gradual improvement in red swollen patches over the next few days.  Recheck or go to the emergency room if you develop marked swelling of the tongue or throat, or marked shortness of breath.

## 2017-10-26 NOTE — ED Provider Notes (Signed)
[facility]    CSN: [id number] Arrival date & time: 10/26/17  1618     History   Chief Complaint No chief complaint on file.   HPI Tina Dickson is a 22 y.o. female.   She presents today with some lesions on her elbows and left thigh that started yesterday.  She wonders if these are bug bites, although she does not really remember being bitten or stung.  These are large, indurated, red lesions that are itchy and somewhat painful, 2 of the left elbow, one at the right elbow, and one on the left thigh.  She has a little bit of headache.  Not coughing, not wheezing, not short of breath.  No GI upset. She reports one prior episode of large red swelling, this occurred on her left thigh several years ago and took a couple of days to settle down.   HPI  Past Medical History:  Diagnosis Date  . Abdominal pain, recurrent    characterized as crampy, spasmodic pain  . Asthma    as a child- not since 2010  . Chronic constipation     Patient Active Problem List   Diagnosis Date Noted  . SVD (spontaneous vaginal delivery) 12/28/2016  . Postpartum care following vaginal delivery 12/28/2016  . Gestational hypertension w/o significant proteinuria in 3rd trimester 12/27/2016  . Dysmenorrhea in adolescent 04/17/2014  . Vaginal discharge 04/17/2014  . Allergic rhinitis 08/04/2013  . Unspecified vitamin D deficiency 05/07/2013  . 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 Chronic constipation   . ECZEMA 12/24/2006    Past Surgical History:  Procedure Laterality Date  . HERNIA REPAIR  1998    OB History    Gravida  1   Para  1   Term  1   Preterm  0   AB  0   Living  0     SAB  0   TAB  0   Ectopic  0   Multiple  0   Live Births               Home Medications    Prior to Admission medications   Medication Sig Start Date End Date Taking? Authorizing Provider  ibuprofen (ADVIL,MOTRIN) 800 MG tablet Take 1 tablet (800 mg total) by mouth every 8  (eight) hours as needed. 12/30/16  Yes Bovard-Stuckert, Jody, MD  oxymetazoline (AFRIN) 0.05 % nasal spray Place 1 spray into both nostrils 2 (two) times daily as needed for congestion.   Yes [provider]  Prenatal Vit-Fe Fumarate-FA (PRENATAL MULTIVITAMIN) TABS tablet Take 1 tablet by mouth daily at 12 noon. 12/30/16  Yes Bovard-Stuckert, Jody, MD  cetirizine (ZYRTEC) 10 MG tablet Take 1 tablet (10 mg total) by mouth daily for 10 days. 10/26/17 11/05/17  Isa Rankin, MD  predniSONE (DELTASONE) 20 MG tablet Take 1 tablet (20 mg total) by mouth daily. 3 tabs qd x 3d then 2 tabs qd x 3d then 1 tab qd x 3d then 0.5 tab qd x 4d then stop. 10/26/17   Isa Rankin, MD  promethazine (PHENERGAN) 25 MG tablet Take 1 tablet (25 mg total) by mouth every 6 (six) hours as needed for nausea or vomiting. 06/23/16   Judeth Horn, NP    Family History Family History  Problem Relation Age of Onset  . Asthma Brother   . Lactose intolerance Mother   . Diabetes Father   . Cancer Maternal Grandmother   . Diabetes  Paternal Grandfather   . Irritable bowel syndrome Neg Hx     Social History Social History   Tobacco Use  . Smoking status: Never Smoker  . Smokeless tobacco: Never Used  Substance Use Topics  . Alcohol use: No  . Drug use: No     Allergies   Patient has no known allergies.   Review of Systems Review of Systems  All other systems reviewed and are negative.    Physical Exam Triage Vital Signs ED Triage Vitals  Enc Vitals Group     BP 10/26/17 1633 133/76     Pulse Rate 10/26/17 1633 88     Resp 10/26/17 1633 16     Temp 10/26/17 1633 98.5 F (36.9 C)     Temp Source 10/26/17 1633 Oral     SpO2 10/26/17 1633 100 %     Weight --      Height --      Pain Score 10/26/17 1635 7     Pain Loc --    Updated Vital Signs BP 133/76 (BP Location: Right Arm)   Pulse 88   Temp 98.5 F (36.9 C) (Oral)   Resp 16   LMP 10/26/2017   SpO2 100%  Physical Exam    Constitutional: She is oriented to person, place, and time. No distress.  HENT:  Head: Atraumatic.  Eyes:  Conjugate gaze observed, no eye redness/discharge  Neck: Neck supple.  Cardiovascular: Normal rate.  Pulmonary/Chest: No respiratory distress.  Abdominal: She exhibits no distension.  Musculoskeletal: Normal range of motion.  Able to flex and extend both elbows  Neurological: She is alert and oriented to person, place, and time.  Skin: Skin is warm and dry.  See photos.  Lesions are erythematous, blanching, indurated.  Edges are sharply demarcated. 2-1/2 cm similar lesion on the left thigh.  Nursing note and vitals reviewed.        UC Treatments / Results   Procedures Procedures (including critical care time)  Medications Ordered in UC Medications  methylPREDNISolone sodium succinate (SOLU-MEDROL) 125 mg/2 mL injection 125 mg (125 mg Intramuscular Given 10/26/17 1711)   Final Clinical Impressions(s) / UC Diagnoses   Final diagnoses:  Angioedema, initial encounter     Discharge Instructions     Injection of Solu-Medrol, a steroid, was given today.  Prescriptions for prednisone, a steroid, and cetirizine, an antihistamine, were sent to the pharmacy.  You can take additional Benadryl (diphenhydramine) OTC as needed to help decrease swelling and discomfort.  Ice for 5 to 10 minutes several times daily may also help decrease swelling and discomfort.  Anticipate gradual improvement in red swollen patches over the next few days.  Recheck or go to the emergency room if you develop marked swelling of the tongue or throat, or marked shortness of breath.     ED Prescriptions    Medication Sig Dispense Auth. Provider   predniSONE (DELTASONE) 20 MG tablet Take 1 tablet (20 mg total) by mouth daily. 3 tabs qd x 3d then 2 tabs qd x 3d then 1 tab qd x 3d then 0.5 tab qd x 4d then stop. 20 tablet Isa RankinMurray, Kylene Zamarron Wilson, MD   cetirizine (ZYRTEC) 10 MG tablet Take 1 tablet (10 mg  total) by mouth daily for 10 days. 10 tablet Isa RankinMurray, Daisey Caloca Wilson, MD        Isa RankinMurray, Aleena Kirkeby Wilson, MD 11/01/17 [phone]

## 2017-10-26 NOTE — ED Triage Notes (Signed)
Pt presents with possible spider bites on both arms and right leg. Complaints of soreness and headaches.

## 2017-12-24 ENCOUNTER — Other Ambulatory Visit: Payer: Self-pay | Admitting: Advanced Practice Midwife

## 2017-12-24 ENCOUNTER — Encounter (HOSPITAL_COMMUNITY): Payer: Self-pay | Admitting: *Deleted

## 2017-12-24 ENCOUNTER — Inpatient Hospital Stay (HOSPITAL_COMMUNITY)
Admission: AD | Admit: 2017-12-24 | Discharge: 2017-12-24 | Disposition: A | Discharge status: Home / Self Care | Payer: Medicaid Other | Source: Ambulatory Visit | Attending: Obstetrics and Gynecology | Admitting: Obstetrics and Gynecology

## 2017-12-24 ENCOUNTER — Other Ambulatory Visit: Payer: Self-pay

## 2017-12-24 DIAGNOSIS — N909 Noninflammatory disorder of vulva and perineum, unspecified: Secondary | ICD-10-CM | POA: Diagnosis present

## 2017-12-24 DIAGNOSIS — L308 Other specified dermatitis: Secondary | ICD-10-CM | POA: Insufficient documentation

## 2017-12-24 DIAGNOSIS — L309 Dermatitis, unspecified: Secondary | ICD-10-CM

## 2017-12-24 HISTORY — DX: Dermatitis, unspecified: L30.9

## 2017-12-24 LAB — URINALYSIS, ROUTINE W REFLEX MICROSCOPIC
Bilirubin Urine: NEGATIVE
GLUCOSE, UA: NEGATIVE mg/dL
KETONES UR: NEGATIVE mg/dL
NITRITE: NEGATIVE
PH: 8 (ref 5.0–8.0)
PROTEIN: NEGATIVE mg/dL
Specific Gravity, Urine: 1.017 (ref 1.005–1.030)

## 2017-12-24 LAB — POCT PREGNANCY, URINE: Preg Test, Ur: NEGATIVE

## 2017-12-24 MED ORDER — TRIAMCINOLONE ACETONIDE 0.025 % EX OINT: 1.0000 "application " | TOPICAL_OINTMENT | Freq: Two times a day (BID) | CUTANEOUS | 0 refills | Status: DC

## 2017-12-24 MED ORDER — NYSTATIN-TRIAMCINOLONE 100000-0.1 UNIT/GM-% EX CREA: TOPICAL_CREAM | CUTANEOUS | 0 refills | Status: DC

## 2017-12-24 NOTE — MAU Provider Note (Signed)
History     CSN: [id number]  Arrival date and time: 12/24/17 1122   First Provider Initiated Contact with Patient 12/24/17 1214      Chief Complaint  Patient presents with  . perinum irritated   HPI Tina Dickson is a 22 y.o. G1P1000 non pregnant patient who presents to MAU with chief complaint of vulvar irritation in the setting of use of new menstrual pads. Patient uses Nexplanon for contraception, reports she switched to a new menstrual pad to accommodate constant spotting, and immediately experienced vulvar irritation. Managing discomfort by putting OTC Monistat cream and states it is helping. Denies urinary symptoms, pain, abnormal vaginal discharge, fever, recent illness.  Pertinent Gynecological History: Menses: flow is light Bleeding: intermenstrual bleeding Contraception: Nexplanon DES exposure: denies Blood transfusions: none Sexually transmitted diseases: no past history Previous GYN Procedures: N/A  Last mammogram: N/A age 37  Last pap: N/A    Past Medical History:  Diagnosis Date  . Abdominal pain, recurrent    characterized as crampy, spasmodic pain  . Asthma    as a child- not since 2010  . Chronic constipation   . Eczema     Past Surgical History:  Procedure Laterality Date  . HERNIA REPAIR  1998    Family History  Problem Relation Age of Onset  . Asthma Brother   . Lactose intolerance Mother   . Asthma Mother   . Diabetes Father   . Cancer Maternal Grandmother   . Diabetes Paternal Grandfather   . Irritable bowel syndrome Neg Hx     Social History   Tobacco Use  . Smoking status: Never Smoker  . Smokeless tobacco: Never Used  Substance Use Topics  . Alcohol use: No  . Drug use: No    Allergies: No Known Allergies  Medications Prior to Admission  Medication Sig Dispense Refill Last Dose  . cetirizine (ZYRTEC) 10 MG tablet Take 1 tablet (10 mg total) by mouth daily for 10 days. 10 tablet 0   . ibuprofen (ADVIL,MOTRIN) 800 MG  tablet Take 1 tablet (800 mg total) by mouth every 8 (eight) hours as needed. 45 tablet 1 Past Month at Unknown time  . oxymetazoline (AFRIN) 0.05 % nasal spray Place 1 spray into both nostrils 2 (two) times daily as needed for congestion.   Past Month at Unknown time  . predniSONE (DELTASONE) 20 MG tablet Take 1 tablet (20 mg total) by mouth daily. 3 tabs qd x 3d then 2 tabs qd x 3d then 1 tab qd x 3d then 0.5 tab qd x 4d then stop. 20 tablet 0   . Prenatal Vit-Fe Fumarate-FA (PRENATAL MULTIVITAMIN) TABS tablet Take 1 tablet by mouth daily at 12 noon. 100 tablet 3 10/26/2017 at Unknown time  . promethazine (PHENERGAN) 25 MG tablet Take 1 tablet (25 mg total) by mouth every 6 (six) hours as needed for nausea or vomiting. 30 tablet 0     Review of Systems  Constitutional: Negative for fever.  Gastrointestinal: Negative for abdominal pain.  Genitourinary: Negative for difficulty urinating, dyspareunia, dysuria, vaginal bleeding, vaginal discharge and vaginal pain.       External perineal discomfort, worse at labia majora and clitoral hood  Neurological: Negative for dizziness.  All other systems reviewed and are negative.  Physical Exam   Blood pressure 123/76, pulse 72, temperature 98.5 F (36.9 C), temperature source Oral, resp. rate 17, weight 90.2 kg, SpO2 100 %, unknown if currently breastfeeding.  Physical Exam  Nursing note and  vitals reviewed. Constitutional: She is oriented to person, place, and time. She appears well-developed and well-nourished.  Cardiovascular: Normal rate and intact distal pulses.  Respiratory: Effort normal.  GI: Soft. She exhibits no distension. There is no tenderness.  Genitourinary: There is tenderness on the right labia. There is erythema and tenderness in the vagina.  Neurological: She is alert and oriented to person, place, and time. She has normal reflexes.  Skin: Skin is warm and dry.  Psychiatric: She has a normal mood and affect. Her behavior is  normal. Judgment and thought content normal.    MAU Course  Procedures  MDM --Pt declines swab for possible yeast infection, BV. --Denies urinary complaints, not pregnant. Urine culture ordered based on UA WBCs and Leukocytes  Patient Vitals for the past 24 hrs:  BP Temp Temp src Pulse Resp SpO2 Weight  12/24/17 1240 133/79 - - (!) 115 - - -  12/24/17 1148 123/76 98.5 F (36.9 C) Oral 72 17 100 % 90.2 kg    Results for orders placed or performed during the hospital encounter of 12/24/17 (from the past 24 hour(s))  Urinalysis, Routine w reflex microscopic     Status: Abnormal   Collection Time: 12/24/17 12:00 PM  Result Value Ref Range   Color, Urine YELLOW YELLOW   APPearance CLEAR CLEAR   Specific Gravity, Urine 1.017 1.005 - 1.030   pH 8.0 5.0 - 8.0   Glucose, UA NEGATIVE NEGATIVE mg/dL   Hgb urine dipstick LARGE (A) NEGATIVE   Bilirubin Urine NEGATIVE NEGATIVE   Ketones, ur NEGATIVE NEGATIVE mg/dL   Protein, ur NEGATIVE NEGATIVE mg/dL   Nitrite NEGATIVE NEGATIVE   Leukocytes, UA MODERATE (A) NEGATIVE   RBC / HPF 0-5 0 - 5 RBC/hpf   WBC, UA 6-10 0 - 5 WBC/hpf   Bacteria, UA RARE (A) NONE SEEN   Squamous Epithelial / LPF 6-10 0 - 5   Mucus PRESENT   Pregnancy, urine POC     Status: None   Collection Time: 12/24/17 12:03 PM  Result Value Ref Range   Preg Test, Ur NEGATIVE NEGATIVE    Meds ordered this encounter  Medications  . nystatin-triamcinolone (MYCOLOG II) cream    Sig: Apply to affected area daily    Dispense:  15 g    Refill:  0    Order Specific Question:   Supervising Provider    Answer:   Reva BoresPRATT, TANYA S [[id number]]   Assessment and Plan  --22 y.o. G1P1000 with Nexplanon in place --Vaginal dermatitis, rx as described above --Urine culture pending, follow up PRN --Pt to discontinue use of new soaps, menstrual pads, fragrances in her hygiene routine --Discharge home in stable condition  F/U: Pt to follow up outpatient PRN  Calvert CantorSamantha C Sausha Raymond,  CNM 12/24/2017, 1:22 PM

## 2017-12-24 NOTE — MAU Note (Signed)
Her vulva is inflamed, seems really irritated in vaginal area.  First noted on Friday.   Has been constantly on her cycle since July from birth control.  ? Borrowed a different type of pad from a friend, seemed irritated after that.

## 2017-12-24 NOTE — Progress Notes (Unsigned)
Rx updated to allow for medicaid coverage. Tina BiblesSamantha Dimitrious Dickson, [place] 12/24/17 6:48 PM

## 2017-12-24 NOTE — Discharge Instructions (Signed)

## 2017-12-28 LAB — URINE CULTURE: Culture: NO GROWTH

## 2018-01-14 ENCOUNTER — Other Ambulatory Visit: Payer: Self-pay

## 2018-01-14 ENCOUNTER — Encounter (HOSPITAL_COMMUNITY): Payer: Self-pay

## 2018-01-14 ENCOUNTER — Inpatient Hospital Stay (HOSPITAL_COMMUNITY)
Admission: AD | Admit: 2018-01-14 | Discharge: 2018-01-14 | Disposition: A | Discharge status: Home / Self Care | Payer: Medicaid Other | Source: Ambulatory Visit | Attending: Obstetrics and Gynecology | Admitting: Obstetrics and Gynecology

## 2018-01-14 DIAGNOSIS — B3731 Acute candidiasis of vulva and vagina: Secondary | ICD-10-CM

## 2018-01-14 DIAGNOSIS — N898 Other specified noninflammatory disorders of vagina: Secondary | ICD-10-CM

## 2018-01-14 DIAGNOSIS — B373 Candidiasis of vulva and vagina: Secondary | ICD-10-CM | POA: Diagnosis not present

## 2018-01-14 DIAGNOSIS — L293 Anogenital pruritus, unspecified: Secondary | ICD-10-CM | POA: Diagnosis present

## 2018-01-14 DIAGNOSIS — Z789 Other specified health status: Secondary | ICD-10-CM

## 2018-01-14 LAB — URINALYSIS, ROUTINE W REFLEX MICROSCOPIC
Bilirubin Urine: NEGATIVE
Glucose, UA: NEGATIVE mg/dL
Ketones, ur: NEGATIVE mg/dL
Nitrite: NEGATIVE
Protein, ur: NEGATIVE mg/dL
RBC / HPF: >50 RBC/hpf — ABNORMAL HIGH (ref 0–5)
Specific Gravity, Urine: 1.025 (ref 1.005–1.030)
pH: 5 (ref 5.0–8.0)

## 2018-01-14 LAB — WET PREP, GENITAL
Clue Cells Wet Prep HPF POC: NONE SEEN
Sperm: NONE SEEN
Trich, Wet Prep: NONE SEEN

## 2018-01-14 LAB — POCT PREGNANCY, URINE: Preg Test, Ur: NEGATIVE

## 2018-01-14 MED ORDER — FLUCONAZOLE 150 MG PO TABS: 150.0000 mg | ORAL_TABLET | Freq: Every day | ORAL | 1 refills | Status: DC

## 2018-01-14 NOTE — MAU Note (Signed)
Pt presents to MAU with c/o vaginal itching and discharge that started x 2 weeks ago.

## 2018-01-14 NOTE — MAU Provider Note (Signed)
History     CSN: [id number]  Arrival date and time: 01/14/18 8413   First Provider Initiated Contact with Patient 01/14/18 [phone]      Chief Complaint  Patient presents with  . Vaginal Itching   Tina Dickson is a 22 y.o. G1P1 not currently pregnant who presents to MAU with complaints of vaginal itching. She reports vaginal itching has been occurring for the past 2-3 weeks, was seen in MAU for same complaint late September but declined testing at that time. She describes vaginal itching and irritation as being "inside of her vagina". She denies abnormal vaginal discharge, denies odor. Reports irregular vaginal spotting due to Nexplanon and denies currently bleeding. She denies recent IC since August and denies changing partners. Denies abdominal pain, HA, or urinary symptoms.    OB History    Gravida  1   Para  1   Term  1   Preterm  0   AB  0   Living  1     SAB  0   TAB  0   Ectopic  0   Multiple  0   Live Births              Past Medical History:  Diagnosis Date  . Abdominal pain, recurrent    characterized as crampy, spasmodic pain  . Asthma    as a child- not since 2010  . Chronic constipation   . Eczema     Past Surgical History:  Procedure Laterality Date  . HERNIA REPAIR  1998    Family History  Problem Relation Age of Onset  . Asthma Brother   . Lactose intolerance Mother   . Asthma Mother   . Diabetes Father   . Cancer Maternal Grandmother   . Diabetes Paternal Grandfather   . Irritable bowel syndrome Neg Hx     Social History   Tobacco Use  . Smoking status: Never Smoker  . Smokeless tobacco: Never Used  Substance Use Topics  . Alcohol use: No  . Drug use: No    Allergies: No Known Allergies  Medications Prior to Admission  Medication Sig Dispense Refill Last Dose  . cetirizine (ZYRTEC) 10 MG tablet Take 1 tablet (10 mg total) by mouth daily for 10 days. 10 tablet 0   . ibuprofen (ADVIL,MOTRIN) 800 MG tablet Take 1  tablet (800 mg total) by mouth every 8 (eight) hours as needed. 45 tablet 1 Past Month at Unknown time  . oxymetazoline (AFRIN) 0.05 % nasal spray Place 1 spray into both nostrils 2 (two) times daily as needed for congestion.   Past Month at Unknown time  . triamcinolone (KENALOG) 0.025 % ointment Apply 1 application topically 2 (two) times daily. 30 g 0     Review of Systems  Constitutional: Negative.   Respiratory: Negative.   Cardiovascular: Negative.   Gastrointestinal: Negative.   Genitourinary: Negative for difficulty urinating, dysuria, frequency, pelvic pain, urgency, vaginal bleeding and vaginal discharge.       Vaginal itching/irritation  Neurological: Negative.    Physical Exam   Blood pressure 130/66, pulse 78, temperature 98.3 F (36.8 C), temperature source Oral, resp. rate 18, height 5\' 3"  (1.6 m), weight 91.6 kg, unknown if currently breastfeeding.  Physical Exam  Nursing note and vitals reviewed. Constitutional: She is oriented to person, place, and time. She appears well-developed and well-nourished. No distress.  Cardiovascular: Normal rate, regular rhythm and normal heart sounds.  Respiratory: Effort normal and breath sounds normal.  No respiratory distress. She has no wheezes.  GI: Soft. Bowel sounds are normal. She exhibits no distension. There is no tenderness. There is no rebound.  Genitourinary:  Genitourinary Comments: Pelvic exam: redness and irritation noted on inside of labia, no vaginal discharge present at introitus, blind vaginal swabs collected. Bimanual examination: negative CMT, uterus non tender   Neurological: She is alert and oriented to person, place, and time.  Psychiatric: She has a normal mood and affect. Her behavior is normal. Thought content normal.   MAU Course  Procedures  MDM Orders Placed This Encounter  Procedures  . Wet prep, genital  . Urinalysis, Routine w reflex microscopic  . Pregnancy, urine POC   Results for orders  placed or performed during the hospital encounter of 01/14/18 (from the past 24 hour(s))  Pregnancy, urine POC     Status: None   Collection Time: 01/14/18  8:54 AM  Result Value Ref Range   Preg Test, Ur NEGATIVE NEGATIVE  Urinalysis, Routine w reflex microscopic     Status: Abnormal   Collection Time: 01/14/18  8:59 AM  Result Value Ref Range   Color, Urine YELLOW YELLOW   APPearance CLOUDY (A) CLEAR   Specific Gravity, Urine 1.025 1.005 - 1.030   pH 5.0 5.0 - 8.0   Glucose, UA NEGATIVE NEGATIVE mg/dL   Hgb urine dipstick LARGE (A) NEGATIVE   Bilirubin Urine NEGATIVE NEGATIVE   Ketones, ur NEGATIVE NEGATIVE mg/dL   Protein, ur NEGATIVE NEGATIVE mg/dL   Nitrite NEGATIVE NEGATIVE   Leukocytes, UA LARGE (A) NEGATIVE   RBC / HPF >50 (H) 0 - 5 RBC/hpf   WBC, UA 21-50 0 - 5 WBC/hpf   Bacteria, UA RARE (A) NONE SEEN   Squamous Epithelial / LPF 11-20 0 - 5   Mucus PRESENT   Wet prep, genital     Status: Abnormal   Collection Time: 01/14/18  9:01 AM  Result Value Ref Range   Yeast Wet Prep HPF POC PRESENT (A) NONE SEEN   Trich, Wet Prep NONE SEEN NONE SEEN   Clue Cells Wet Prep HPF POC NONE SEEN NONE SEEN   WBC, Wet Prep HPF POC MANY (A) NONE SEEN   Sperm NONE SEEN    Wet prep- positive for yeast infection  Urine culture and GC/C pending  UPT negative   Reviewed results of labs with patient. Rx for Diflucan sent to pharmacy of choice for treatment. Discussed reasons to return to MAU. Pt stable at time of discharge.  Assessment and Plan   1. Vulvovaginal candidiasis   2. Vaginal itching   3. Not currently pregnant    Discharge home  Rx for Diflucan  Discussed reasons to return to MAU  Follow up at urgent care or office for worsening symptoms    Sharyon Cable CNM  01/14/2018, 9:01 AM

## 2018-01-14 NOTE — Discharge Instructions (Signed)

## 2018-01-15 LAB — GC/CHLAMYDIA PROBE AMP (~~LOC~~) NOT AT ARMC
Chlamydia: NEGATIVE
Neisseria Gonorrhea: NEGATIVE

## 2018-01-18 ENCOUNTER — Other Ambulatory Visit: Payer: Self-pay

## 2018-01-18 ENCOUNTER — Encounter (HOSPITAL_COMMUNITY): Payer: Self-pay

## 2018-01-18 ENCOUNTER — Emergency Department (HOSPITAL_COMMUNITY)
Admission: EM | Admit: 2018-01-18 | Discharge: 2018-01-18 | Disposition: A | Discharge status: Home / Self Care | Payer: Medicaid Other | Attending: Emergency Medicine | Admitting: Emergency Medicine

## 2018-01-18 ENCOUNTER — Emergency Department (HOSPITAL_COMMUNITY): Payer: Medicaid Other

## 2018-01-18 DIAGNOSIS — Y92410 Unspecified street and highway as the place of occurrence of the external cause: Secondary | ICD-10-CM | POA: Diagnosis not present

## 2018-01-18 DIAGNOSIS — Y998 Other external cause status: Secondary | ICD-10-CM | POA: Insufficient documentation

## 2018-01-18 DIAGNOSIS — Y93I9 Activity, other involving external motion: Secondary | ICD-10-CM | POA: Insufficient documentation

## 2018-01-18 DIAGNOSIS — S161XXA Strain of muscle, fascia and tendon at neck level, initial encounter: Secondary | ICD-10-CM

## 2018-01-18 DIAGNOSIS — S199XXA Unspecified injury of neck, initial encounter: Secondary | ICD-10-CM | POA: Diagnosis present

## 2018-01-18 MED ORDER — IBUPROFEN 800 MG PO TABS: 800.0000 mg | ORAL_TABLET | Freq: Three times a day (TID) | ORAL | 0 refills | Status: DC | PRN

## 2018-01-18 MED ORDER — CYCLOBENZAPRINE HCL 10 MG PO TABS: 10.0000 mg | ORAL_TABLET | Freq: Three times a day (TID) | ORAL | 0 refills | Status: DC | PRN

## 2018-01-18 NOTE — ED Notes (Signed)
Patient transported to X-ray 

## 2018-01-18 NOTE — Discharge Instructions (Addendum)
Your x-rays did not show any abnormalities.  You most likely have muscular pain that is common after this type of accident.  Use ice and heat on your neck
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
## 2018-02-08 ENCOUNTER — Ambulatory Visit (HOSPITAL_COMMUNITY)
Admission: EM | Admit: 2018-02-08 | Discharge: 2018-02-08 | Disposition: A | Discharge status: Home / Self Care | Payer: Medicaid Other | Attending: Internal Medicine | Admitting: Internal Medicine

## 2018-02-08 ENCOUNTER — Encounter (HOSPITAL_COMMUNITY): Payer: Self-pay | Admitting: Emergency Medicine

## 2018-02-08 DIAGNOSIS — M542 Cervicalgia: Secondary | ICD-10-CM | POA: Diagnosis not present

## 2018-02-08 MED ORDER — ACETAMINOPHEN ER 650 MG PO TBCR: 650.0000 mg | EXTENDED_RELEASE_TABLET | Freq: Three times a day (TID) | ORAL | 0 refills | Status: DC | PRN

## 2018-02-08 MED ORDER — MELOXICAM 7.5 MG PO TABS: 7.5000 mg | ORAL_TABLET | Freq: Every day | ORAL | 0 refills | Status: DC
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
## 2018-02-08 NOTE — Discharge Instructions (Signed)
Start Mobic. Do not take ibuprofen (motrin/advil)/ naproxen (aleve) while on mobic. You can take tylenol for break through pain. This may take a few weeks to completely resolve, but should be getting better each week. If noticing decrease in grip strength, numbness/tingling of the finger, follow up with orthopedics for further evaluation needed.
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
## 2018-10-15 ENCOUNTER — Inpatient Hospital Stay (HOSPITAL_COMMUNITY)
Admission: AD | Admit: 2018-10-15 | Discharge: 2018-10-15 | Disposition: A | Discharge status: Home / Self Care | Payer: Medicaid Other | Attending: Emergency Medicine | Admitting: Emergency Medicine

## 2018-10-15 ENCOUNTER — Other Ambulatory Visit: Payer: Self-pay

## 2018-10-15 DIAGNOSIS — R42 Dizziness and giddiness: Secondary | ICD-10-CM

## 2018-10-15 DIAGNOSIS — Z79899 Other long term (current) drug therapy: Secondary | ICD-10-CM | POA: Insufficient documentation

## 2018-10-15 DIAGNOSIS — J45909 Unspecified asthma, uncomplicated: Secondary | ICD-10-CM | POA: Insufficient documentation

## 2018-10-15 LAB — BASIC METABOLIC PANEL
Anion gap: 10 (ref 5–15)
BUN: 10 mg/dL (ref 6–20)
CO2: 24 mmol/L (ref 22–32)
Calcium: 9.3 mg/dL (ref 8.9–10.3)
Chloride: 108 mmol/L (ref 98–111)
Creatinine, Ser: 0.95 mg/dL (ref 0.44–1.00)
GFR calc Af Amer: >60 mL/min (ref >60)
GFR calc non Af Amer: >60 mL/min (ref >60)
Glucose, Bld: 87 mg/dL (ref 70–99)
Potassium: 3.9 mmol/L (ref 3.5–5.1)
Sodium: 142 mmol/L (ref 135–145)

## 2018-10-15 LAB — CBC WITH DIFFERENTIAL/PLATELET
Abs Immature Granulocytes: 0.01 10*3/uL (ref 0.00–0.07)
Basophils Absolute: 0.1 10*3/uL (ref 0.0–0.1)
Basophils Relative: 1 %
Eosinophils Absolute: 0.1 10*3/uL (ref 0.0–0.5)
Eosinophils Relative: 2 %
HCT: 43.3 % (ref 36.0–46.0)
Hemoglobin: 13.7 g/dL (ref 12.0–15.0)
Immature Granulocytes: 0 %
Lymphocytes Relative: 34 %
Lymphs Abs: 1.9 10*3/uL (ref 0.7–4.0)
MCH: 26.8 pg (ref 26.0–34.0)
MCHC: 31.6 g/dL (ref 30.0–36.0)
MCV: 84.7 fL (ref 80.0–100.0)
Monocytes Absolute: 0.4 10*3/uL (ref 0.1–1.0)
Monocytes Relative: 8 %
Neutro Abs: 3.1 10*3/uL (ref 1.7–7.7)
Neutrophils Relative %: 55 %
Platelets: 240 10*3/uL (ref 150–400)
RBC: 5.11 MIL/uL (ref 3.87–5.11)
RDW: 12.6 % (ref 11.5–15.5)
WBC: 5.5 10*3/uL (ref 4.0–10.5)
nRBC: 0 % (ref 0.0–0.2)

## 2018-10-15 LAB — POCT PREGNANCY, URINE: Preg Test, Ur: NEGATIVE

## 2018-10-15 MED ORDER — SODIUM CHLORIDE 0.9 % IV BOLUS
1000.0000 mL | Freq: Once | INTRAVENOUS | Status: AC
  Administered 2018-10-15: 1000 mL via INTRAVENOUS

## 2018-10-15 NOTE — MAU Note (Signed)
Tina Dickson is a 23 y.o. at Unknown here in MAU reporting: that she is having vaginal bleeding, nausea, dizziness since she had her nexplanon placed after her baby was born Jan of 19. Lower abdominal cramping LMP: VB at present Onset of complaint: months Pain score: 4 Vitals:   10/15/18 1011  BP: (!) 141/77  Pulse: 72  Resp: 16  Temp: 98.4 F (36.9 C)  SpO2: 100%     FHT: Lab orders placed from triage: UPT

## 2018-10-15 NOTE — ED Notes (Signed)
ED Provider at bedside.  Pt verbalized understanding of discharge instructions and denies any further questions at this time.

## 2018-10-15 NOTE — ED Triage Notes (Signed)
Pt. Transferred from MAU cause she is having some issues with her birth control pills. Her Birth control is making her dizzy, N/V. Pt. Stated, I have the injection.

## 2018-10-15 NOTE — ED Provider Notes (Signed)
[facility] EMERGENC[facility] DEPARTMENT Provider Note   CSN: [id number] Arrival date & time: 10/15/18  7989    History   Chief Complaint Chief Complaint  Patient presents with  . Vaginal Bleeding  . Nausea  . Dizziness    HPI Tina Dickson is a 23 y.o. female.who presents to the ED for evaluation of dizziness.   Harley states that she had her Nexplanon implanted approximately 1.5 years ago and that since then she has had intermittent nausea with eating and dizziness. Her dizziness occurs every 2 days approximately. Denies changes in dizziness on changes in position or oral intake. Says she has some vision changes with dizziness but denies syncope or LOC. Alleviated by lying down. She is drinking 1 gallon of water per day. She also notes vaginal bleeding that has been consistent since getting Nexplanon that is light but continues for days and one stops for 1-2 days at a time. Bleeding occurs with lower abdominal cramping that is intermittent in nature.   Her current visit is prompted by an acute worsening in dizziness yesterday with some vision changes described as seeing dots. Dizziness did not occur with position change. Denies headache, any recent illnesses, SOB, weakness, or LOC. States she is worried to be alone with her daughter in case dizziness acute worsens and she is not able to take care of her. She would like her Nexplanon taken out immediately if possible.   Patient was sent over from MAU where a pregnancy test was negative.   HPI  Past Medical History:  Diagnosis Date  . Abdominal pain, recurrent    characterized as crampy, spasmodic pain  . Asthma    as a child- not since 2010  . Chronic constipation   . Eczema     Patient Active Problem List   Diagnosis Date Noted  . SVD (spontaneous vaginal delivery) 12/28/2016  . Postpartum care following vaginal delivery 12/28/2016  . Gestational hypertension w/o significant proteinuria in 3rd trimester  12/27/2016  . Dysmenorrhea in adolescent 04/17/2014  . Vaginal discharge 04/17/2014  . Allergic rhinitis 08/04/2013  . Unspecified vitamin D deficiency 05/07/2013  . 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 Chronic constipation   . ECZEMA 12/24/2006    Past Surgical History:  Procedure Laterality Date  . HERNIA REPAIR  1998     OB History    Gravida  1   Para  1   Term  1   Preterm  0   AB  0   Living  1     SAB  0   TAB  0   Ectopic  0   Multiple  0   Live Births               Home Medications    Prior to Admission medications   Medication Sig Start Date End Date Taking? Authorizing Provider  acetaminophen (TYLENOL 8 HOUR) 650 MG CR tablet Take 1 tablet (650 mg total) by mouth every 8 (eight) hours as needed for pain. 02/08/18   Tasia Catchings, Amy V, PA-C  cetirizine (ZYRTEC) 10 MG tablet Take 1 tablet (10 mg total) by mouth daily for 10 days. Patient not taking: Reported on 01/18/2018 10/26/17 11/05/17  Wynona Luna, MD  cyclobenzaprine (FLEXERIL) 10 MG tablet Take 1 tablet (10 mg total) by mouth 3 (three) times daily as needed for muscle spasms. 01/18/18   Lawyer, Harrell Gave, PA-C  fluconazole (DIFLUCAN) 150 MG tablet Take 1 tablet (150 mg total) by mouth daily.  Patient not taking: Reported on 01/18/2018 01/14/18   Sharyon Cableogers, Veronica C, CNM  ibuprofen (ADVIL,MOTRIN) 800 MG tablet Take 1 tablet (800 mg total) by mouth every 8 (eight) hours as needed. 01/18/18   Lawyer, Cristal Deerhristopher, PA-C  meloxicam (MOBIC) 7.5 MG tablet Take 1 tablet (7.5 mg total) by mouth daily. 02/08/18   Cathie HoopsYu, Amy V, PA-C  oxymetazoline (AFRIN) 0.05 % nasal spray Place 1 spray into both nostrils 2 (two) times daily as needed for congestion.    [provider]  triamcinolone (KENALOG) 0.025 % ointment Apply 1 application topically 2 (two) times daily. Patient not taking: Reported on 02/08/2018 12/24/17   Calvert CantorWeinhold, Samantha C, CNM    Family History Family History  Problem Relation Age of  Onset  . Asthma Brother   . Lactose intolerance Mother   . Asthma Mother   . Diabetes Father   . Cancer Maternal Grandmother   . Diabetes Paternal Grandfather   . Irritable bowel syndrome Neg Hx     Social History Social History   Tobacco Use  . Smoking status: Never Smoker  . Smokeless tobacco: Never Used  Substance Use Topics  . Alcohol use: No  . Drug use: No     Allergies   Patient has no known allergies.   Review of Systems Review of Systems  Constitutional: Positive for appetite change (due to nausea). Negative for chills and fever.  HENT: Negative for congestion, rhinorrhea and sore throat.   Eyes: Positive for visual disturbance (seeing dots).  Respiratory: Negative for cough and shortness of breath.   Cardiovascular: Negative for chest pain and palpitations.  Gastrointestinal: Positive for abdominal pain (cramping) and nausea. Negative for diarrhea and vomiting.  Genitourinary: Positive for vaginal bleeding.  Musculoskeletal: Negative for arthralgias and myalgias.  Skin: Negative for rash and wound.  Neurological: Positive for dizziness. Negative for syncope and weakness.  All other systems reviewed and are negative.    Physical Exam Updated Vital Signs BP 115/77 (BP Location: Left Arm)   Pulse 62   Temp 97.8 F (36.6 C) (Oral)   Resp 17   SpO2 100%   Physical Exam Vitals signs and nursing note reviewed.  Constitutional:      General: She is not in acute distress.    Appearance: Normal appearance. She is not toxic-appearing.  HENT:     Head: Normocephalic and atraumatic.  Eyes:     Extraocular Movements: Extraocular movements intact.     Conjunctiva/sclera: Conjunctivae normal.     Pupils: Pupils are equal, round, and reactive to light.  Cardiovascular:     Rate and Rhythm: Normal rate and regular rhythm.     Heart sounds: Normal heart sounds. No murmur. No gallop.   Pulmonary:     Effort: Pulmonary effort is normal. No respiratory distress.      Breath sounds: Normal breath sounds. No wheezing, rhonchi or rales.  Abdominal:     General: There is no distension.     Palpations: Abdomen is soft.     Tenderness: There is no abdominal tenderness. There is no guarding.  Skin:    General: Skin is warm and dry.     Coloration: Skin is not jaundiced or pale.  Neurological:     General: No focal deficit present.     Mental Status: She is alert and oriented to person, place, and time. Mental status is at baseline.  Psychiatric:        Mood and Affect: Mood normal.  Behavior: Behavior normal.      ED Treatments / Results  Labs (all labs ordered are listed, but only abnormal results are displayed) Labs Reviewed  BASIC METABOLIC PANEL  CBC WITH DIFFERENTIAL/PLATELET  POCT PREGNANCY, URINE    EKG None  Radiology No results found.  Procedures Procedures (including critical care time)  Medications Ordered in ED Medications  sodium chloride 0.9 % bolus 1,000 mL (0 mLs Intravenous Stopped 10/15/18 1338)     Initial Impression / Assessment and Plan / ED Course  I have reviewed the triage vital signs and the nursing notes.  Pertinent labs & imaging results that were available during my care of the patient were reviewed by me and considered in my medical decision making (see chart for details).  Mrs. Brooke DareKing is a 23 y/o female who presents to the ED for evaluation of dizziness. It seems like all her symptoms, including dizziness, nausea when eating, vaginal bleeding, abdominal cramping have all started after she had her Nexplanon implanted. Her symptoms are known side effects of this birth control. She would like it removed if possible, however that is not something that can be done in the ED. She was able to make an appointment with the health department on 10/16/18 to removal. Discussed that reversal in symptoms may not be immediate but she will have improvement over time.   For further evaluation of her dizziness, CMP and  CBC were ordered to rule out electrolyte abnormalities and possible anemia due to vaginal bleeding. Her resullts were WNL.  She received some fluids in the ED but not the complete 1L as she had to go pick up her daughter.   Mrs. Brooke DareKing was found to be safe for discharge and she verbalized that she was ready to be discharge. No further steps taken at this time. Discussed returning to the ED if any new symptoms occur or if her symptoms acutely worsen.     Final Clinical Impressions(s) / ED Diagnoses   Final diagnoses:  Dizziness    ED Discharge Orders    None       Verdene LennertBasaraba, Ceanna Wareing, MD 10/16/18 1014    Geoffery Lyonselo, [place], MD 10/17/18 1544

## 2018-10-15 NOTE — MAU Provider Note (Signed)
First Provider Initiated Contact with Patient 10/15/18 1017      S Ms. Tina Dickson is a 23 y.o. [id number] non-pregnant female who presents to MAU today with complaint of dizziness, lightheadedness and weakness. She states that this has been going on for several days. She is home alone with her one year old all day and is worried about caring for her due to these symptoms.  She states that she is not pregnant.   O BP (!) 141/77   Pulse 72   Temp 98.4 F (36.9 C)   Resp 16   SpO2 100%  Physical Exam  Nursing note and vitals reviewed. Constitutional: She is oriented to person, place, and time. She appears well-developed and well-nourished. No distress.  HENT:  Head: Normocephalic.  Respiratory: Effort normal.  Neurological: She is alert and oriented to person, place, and time.  Psychiatric: She has a normal mood and affect.    A Non pregnant female Medical screening exam complete Dizziness and lightheadedness   P Discharge from MAU in stable condition Patient given the option of transfer to [facility] for further evaluation or seek care in outpatient facility of choice Patient requests evaluation in the [facility]. Will notify charge nurse and transport team Warning signs for worsening condition that would warrant emergency follow-up discussed Patient may return to MAU as needed for pregnancy related complaints  Marcille Buffy DNP, CNM  10/15/18  10:23 AM

## 2018-10-15 NOTE — ED Notes (Signed)
Pt has agreed to blood work and some fluids but made [place] aware that she has to leave to get her child from daycare.

## 2018-10-15 NOTE — ED Triage Notes (Signed)
This started in November.

## 2018-10-15 NOTE — ED Notes (Signed)
ED Provider at bedside. 

## 2018-10-15 NOTE — Discharge Instructions (Addendum)
If your symptoms worsen, please return to the ED.

## 2018-12-02 ENCOUNTER — Ambulatory Visit (INDEPENDENT_AMBULATORY_CARE_PROVIDER_SITE_OTHER)
Admission: RE | Admit: 2018-12-02 | Discharge: 2018-12-02 | Disposition: A | Discharge status: Home / Self Care | Payer: Self-pay | Source: Ambulatory Visit

## 2018-12-02 DIAGNOSIS — R112 Nausea with vomiting, unspecified: Secondary | ICD-10-CM

## 2018-12-02 MED ORDER — ONDANSETRON 4 MG PO TBDP: 4.0000 mg | ORAL_TABLET | Freq: Three times a day (TID) | ORAL | 0 refills | Status: DC | PRN

## 2018-12-02 NOTE — Discharge Instructions (Addendum)
Zofran for nausea and vomiting as needed. Keep hydrated, you urine should be clear to pale yellow in color. Bland diet, advance as tolerated. Monitor for any worsening of symptoms, nausea or vomiting not controlled by medication, worsening abdominal pain, fever, go to the ED for further evaluation needed.   If develop more cold symptoms such as cough, fever, shortness of breath, may need testing for COVID.

## 2018-12-02 NOTE — ED Provider Notes (Signed)
Virtual Visit via Video Note:  Tina Dickson  initiated request for Telemedicine visit with [facility] Urgent Care team. I connected with Markham Jordan  on 12/02/2018 at 8:39 AM  for a synchronized telemedicine visit using a video enabled HIPPA compliant telemedicine application. I verified that I am speaking with Tina Dickson  using two identifiers. Zhavia Cunanan Jodell Cipro, PA-C  was physically located in a [facility] Urgent care site and [place] was located at a different location.   The limitations of evaluation and management by telemedicine as well as the availability of in-person appointments were discussed. Patient was informed that she  may incur a bill ( including co-pay) for this virtual visit encounter. Tina Dickson  expressed understanding and gave verbal consent to proceed with virtual visit.   History of Present Illness:Tina Dickson  is a 23 y.o. female presents with 2-day history of nausea, vomiting.  She has had 3 episodes of nonbilious nonbloody vomit.  She has generalized abdominal cramping prior to vomiting, which is relieved after.  Denies diarrhea.  Denies fever, chills, night sweats.  Denies URI symptoms such as cough, congestion, sore throat.  Denies loss of taste or smell.  Denies sick contact.  States due to vomiting, sometimes has sweats, and mild dizziness.  Denies one-sided weakness, lightheadedness, losing balance, syncope.  Took Pepto-Bismol with good relief.  Patient with Nexplanon implant in 2019.  Irregular cycles.  Past Medical History:  Diagnosis Date  . Abdominal pain, recurrent    characterized as crampy, spasmodic pain  . Asthma    as a child- not since 2010  . Chronic constipation   . Eczema     No Known Allergies      Observations/Objective: General: Well appearing, nontoxic, no acute distress. Sitting comfortably. Head: Normocephalic, atraumatic Eye: No conjunctival injection, eyelid swelling. EOMI ENT: Mucus membranes moist, no lip  cracking. No obvious nasal drainage. Pulm: Speaking in full sentences without difficulty. Normal effort. No respiratory distress, accessory muscle use. Abd: soft to self palpation. No current abdominal pain. Negative jump test Neuro: Normal mental status. Alert and oriented x 3.    Assessment and Plan: Discussed with patient no alarming signs on exam. Zofran for nausea. Push fluids. Bland diet, advance as tolerated. Return precautions given.  Follow Up Instructions:    I discussed the assessment and treatment plan with the patient. The patient was provided an opportunity to ask questions and all were answered. The patient agreed with the plan and demonstrated an understanding of the instructions.   The patient was advised to call back or seek an in-person evaluation if the symptoms worsen or if the condition fails to improve as anticipated.  I provided 15 minutes of non-face-to-face time during this encounter.    Ok Edwards, PA-C  12/02/2018 8:39 AM         Ok Edwards, PA-C 12/02/18 [phone]

## 2018-12-03 ENCOUNTER — Ambulatory Visit (HOSPITAL_COMMUNITY)
Admission: EM | Admit: 2018-12-03 | Discharge: 2018-12-03 | Disposition: A | Discharge status: Home / Self Care | Payer: Medicaid Other | Attending: Family Medicine | Admitting: Family Medicine

## 2018-12-03 ENCOUNTER — Encounter (HOSPITAL_COMMUNITY): Payer: Self-pay

## 2018-12-03 ENCOUNTER — Other Ambulatory Visit: Payer: Self-pay

## 2018-12-03 DIAGNOSIS — Z20828 Contact with and (suspected) exposure to other viral communicable diseases: Secondary | ICD-10-CM | POA: Diagnosis not present

## 2018-12-03 DIAGNOSIS — B769 Hookworm disease, unspecified: Secondary | ICD-10-CM

## 2018-12-03 MED ORDER — MEBENDAZOLE 100 MG PO CHEW: 100.0000 mg | CHEWABLE_TABLET | Freq: Two times a day (BID) | ORAL | 0 refills | Status: AC

## 2018-12-03 NOTE — ED Triage Notes (Signed)
Patient presents to Urgent Care with complaints of "pullng a tapeworm out of her mouth while brushing her teeth this morning". Patient reports she had an e-visit for vomiting yesterday (has since resolved) and so that is why she thinks she has tapeworms and wants to be treated. Patient also requesting COVID testing today.

## 2018-12-03 NOTE — ED Provider Notes (Signed)
[facility]    CSN: [id number] Arrival date & time: 12/03/18  0818      History   Chief Complaint No chief complaint on file.   HPI Tina Dickson is a 23 y.o. female.   Subjective:   Tina Dickson is a 23 y.o. female who presents for evaluation for a possible parasitic infection. Patient reports that she noted a small white worm in her mouth while brushing her teeth. She later noted another worm in her mouth while driving to the clinic. She denies any oral pain, swelling or bleeding. Of note, the patient underwent an e-visit here on 12/02/18 for a two-day history of abdominal cramping, nausea and vomiting. She took pepo-bismol and Zofran which relieved her symptoms. She denies any current abdominal pain, nausea, vomiting, diarrhea, fevers, chills or malaise. No rash. No recent travel. No contacts with similar symptoms. No known exposure to COVID-19.             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 Authorizing Provider  mebendazole (VERMOX) 100 MG chewable tablet Chew 1 tablet (100 mg total) by mouth 2 (two) times daily for 3 days. 12/03/18 12/06/18  Lurline IdolMurrill, Nahiara Kretzschmar, FNP  ondansetron (ZOFRAN ODT) 4 MG disintegrating tablet Take 1 tablet (4 mg total) by mouth every 8 (eight) hours as needed for nausea or vomiting. 12/02/18   Cathie HoopsYu, Amy V, PA-C  cetirizine (ZYRTEC) 10 MG tablet Take 1 tablet (10 mg total) by mouth daily for 10 days. Patient not taking: Reported on 01/18/2018 10/26/17 12/02/18  Isa RankinMurray, Laura Wilson, MD    Family History Family History  Problem Relation Age of Onset   Asthma Brother    Lactose intolerance Mother    Asthma Mother    Diabetes Father    Cancer Maternal Grandmother    Diabetes Paternal Grandfather    Irritable bowel syndrome Neg Hx     Social History Social History   Tobacco Use   Smoking status: Never Smoker   Smokeless tobacco: Never Used  Substance Use Topics   Alcohol use: No   Drug use: No     Allergies   Patient has no known allergies.   Review of Systems Review of Systems  Constitutional: Negative for appetite change, fatigue and fever.  HENT: Negative.   Respiratory: Negative for cough and shortness of breath.   Gastrointestinal: Positive for abdominal pain, nausea and vomiting. Negative for abdominal distention, blood in stool, constipation, diarrhea and rectal pain.  Genitourinary: Negative.   Musculoskeletal: Negative for myalgias.  Skin: Negative for rash.  Neurological: Negative for headaches.     Physical Exam Triage Vital Signs ED Triage Vitals [12/03/18 0829]  Enc Vitals Group     BP 132/73     Pulse Rate (!) 58     Resp 17     Temp 98.5 F (36.9 C)     Temp src      SpO2 99 %     Weight      Height      Head Circumference      Peak Flow      Pain Score 0     Pain Loc      Pain Edu?      Excl. in GC?    No data found.  Updated Vital Signs BP 132/73 (BP Location: Right  Arm)    Pulse (!) 58    Temp 98.5 F (36.9 C)    Resp 17    SpO2 99%   Visual Acuity Right Eye Distance:   Left Eye Distance:   Bilateral Distance:    Right Eye Near:   Left Eye Near:    Bilateral Near:     Physical Exam Vitals signs reviewed.  Constitutional:      Appearance: Normal appearance.  HENT:     Head: Normocephalic.     Mouth/Throat:     Lips: Pink. No lesions.     Mouth: Mucous membranes are moist. No injury or oral lesions.     Dentition: No gingival swelling or gum lesions.     Tongue: No lesions.     Pharynx: Oropharynx is clear. Uvula midline.     Tonsils: No tonsillar exudate.  Neck:     Musculoskeletal: Normal range of motion and neck supple.  Cardiovascular:     Rate and Rhythm: Normal rate and regular rhythm.  Pulmonary:     Effort: Pulmonary effort is normal.     Breath sounds: Normal breath sounds.  Abdominal:     General: Bowel sounds are normal. There is no distension.     Palpations: Abdomen is soft.     Tenderness: There is no abdominal tenderness.  Musculoskeletal: Normal range of motion.  Skin:    General: Skin is warm and dry.  Neurological:     General: No focal deficit present.     Mental Status: She is alert and oriented to person, place, and time.  Psychiatric:        Mood and Affect: Mood normal.      UC Treatments / Results  Labs (all labs ordered are listed, but only abnormal results are displayed) Labs Reviewed  NOVEL CORONAVIRUS, NAA (HOSP ORDER, SEND-OUT TO REF LAB; TAT 18-24 HRS)    EKG   Radiology No results found.  Procedures Procedures (including critical care time)  Medications Ordered in UC Medications - No data to display  Initial Impression / Assessment and Plan / UC Course  I have reviewed the triage vital signs and the nursing notes.  Pertinent labs & imaging results that were available during my care of the patient were reviewed by me and considered in my medical decision making (see chart for  details).   22  y.o. female who presents for evaluation for a possible parasitic infection as she has had two small white worms in her mouth today as well as a recent history of abdominal cramping, nausea and vomiting. No fevers, chills, malaise or rash.   Plan: - Discussed possible diagnosis with the patient. All questions - answered. - continue PRN antiemetic per medication orders. - Mebendazole 100 mg po BID x 3 days  - Follow up with PCP if not improving.  Today's evaluation has revealed no signs of a dangerous process. Discussed diagnosis with patient. Patient aware of their diagnosis, possible red flag symptoms to watch out for and need for close follow up. Patient understands verbal and written discharge instructions. Patient comfortable with plan and disposition.  Patient has a clear mental status at this time, good insight into illness (after discussion and teaching) and has clear judgment to make decisions regarding their care.  This care was provided during an unprecedented National Emergency due to the Novel Coronavirus (COVID-19) pandemic. COVID-19 infections and transmission risks place heavy strains on healthcare resources.  As this pandemic evolves, our hospital, physicians, and staff strive to respond fluidly, to remain operational, and to provide care relative to available resources and information. Outcomes are unpredictable and treatments are without well-defined guidelines. Further, the impact of COVID-19 on all aspects of emergency care, including the impact to patients seeking care for reasons other than COVID-19, is unavoidable during this national emergency. At this time of the global pandemic, management of patients has significantly changed, even for non-COVID positive patients given high local and regional COVID volumes at this time requiring high healthcare system and resource utilization. The standard of care for management of both COVID suspected and non-COVID suspected  patients continues to change rapidly at the local, regional, national, and global levels. This patient was worked up and treated to the best available but ever changing evidence and resources available at this current time.   Documentation was completed with the aid of voice recognition software. Transcription may contain typographical errors.  Final Clinical Impressions(s) / UC Diagnoses   Final diagnoses:  Hookworm infection     Discharge Instructions     I will treat you today for a possible parasitic infection. I'm not exactly sure if this is what you have. It is possible because you had abdominal cramping, nausea and vomiting as well as visible small white worms in your mouth. Read the attached information and take the medications as prescribed.     ED Prescriptions    Medication Sig Dispense Auth. Provider   mebendazole (VERMOX) 100 MG chewable tablet Chew 1 tablet (100 mg total) by mouth 2 (two) times daily for 3 days. 6 tablet Enrique Sack, FNP     Controlled Substance Prescriptions [place] Controlled Substance Registry consulted? Not Applicable   Enrique Sack, [place] 12/03/18 [phone]

## 2018-12-03 NOTE — Discharge Instructions (Addendum)
I will treat you today for a possible parasitic infection. I'm not exactly sure if this is what you have. It is possible because you had abdominal cramping, nausea and vomiting as well as visible small white worms in your mouth. Read the attached information and take the medications as prescribed.

## 2018-12-04 ENCOUNTER — Encounter (HOSPITAL_COMMUNITY): Payer: Self-pay

## 2018-12-04 LAB — NOVEL CORONAVIRUS, NAA (HOSP ORDER, SEND-OUT TO REF LAB; TAT 18-24 HRS): SARS-CoV-2, NAA: NOT DETECTED

## 2019-01-02 ENCOUNTER — Encounter (HOSPITAL_COMMUNITY): Payer: Self-pay

## 2019-01-02 ENCOUNTER — Emergency Department (HOSPITAL_COMMUNITY)
Admission: EM | Admit: 2019-01-02 | Discharge: 2019-01-02 | Disposition: A | Discharge status: Home / Self Care | Payer: Medicaid Other | Attending: Emergency Medicine | Admitting: Emergency Medicine

## 2019-01-02 ENCOUNTER — Other Ambulatory Visit: Payer: Self-pay

## 2019-01-02 DIAGNOSIS — Z79899 Other long term (current) drug therapy: Secondary | ICD-10-CM | POA: Insufficient documentation

## 2019-01-02 DIAGNOSIS — Y9389 Activity, other specified: Secondary | ICD-10-CM | POA: Insufficient documentation

## 2019-01-02 DIAGNOSIS — X58XXXA Exposure to other specified factors, initial encounter: Secondary | ICD-10-CM | POA: Insufficient documentation

## 2019-01-02 DIAGNOSIS — Y9289 Other specified places as the place of occurrence of the external cause: Secondary | ICD-10-CM | POA: Insufficient documentation

## 2019-01-02 DIAGNOSIS — S0083XA Contusion of other part of head, initial encounter: Secondary | ICD-10-CM

## 2019-01-02 DIAGNOSIS — R55 Syncope and collapse: Secondary | ICD-10-CM | POA: Insufficient documentation

## 2019-01-02 DIAGNOSIS — Y999 Unspecified external cause status: Secondary | ICD-10-CM | POA: Insufficient documentation

## 2019-01-02 DIAGNOSIS — J45909 Unspecified asthma, uncomplicated: Secondary | ICD-10-CM | POA: Insufficient documentation

## 2019-01-02 LAB — URINALYSIS, ROUTINE W REFLEX MICROSCOPIC
Bilirubin Urine: NEGATIVE
Glucose, UA: NEGATIVE mg/dL
Ketones, ur: 5 mg/dL — AB
Nitrite: NEGATIVE
Protein, ur: 100 mg/dL — AB
RBC / HPF: >50 RBC/hpf — ABNORMAL HIGH (ref 0–5)
Specific Gravity, Urine: 1.026 (ref 1.005–1.030)
pH: 6 (ref 5.0–8.0)

## 2019-01-02 LAB — CBC
HCT: 40.5 % (ref 36.0–46.0)
Hemoglobin: 13.1 g/dL (ref 12.0–15.0)
MCH: 27.7 pg (ref 26.0–34.0)
MCHC: 32.3 g/dL (ref 30.0–36.0)
MCV: 85.6 fL (ref 80.0–100.0)
Platelets: 221 10*3/uL (ref 150–400)
RBC: 4.73 MIL/uL (ref 3.87–5.11)
RDW: 12.7 % (ref 11.5–15.5)
WBC: 6.7 10*3/uL (ref 4.0–10.5)
nRBC: 0 % (ref 0.0–0.2)

## 2019-01-02 LAB — I-STAT BETA HCG BLOOD, ED (MC, WL, AP ONLY): I-stat hCG, quantitative: <5 m[IU]/mL (ref <5)

## 2019-01-02 LAB — BASIC METABOLIC PANEL
Anion gap: 8 (ref 5–15)
BUN: 10 mg/dL (ref 6–20)
CO2: 24 mmol/L (ref 22–32)
Calcium: 9.2 mg/dL (ref 8.9–10.3)
Chloride: 108 mmol/L (ref 98–111)
Creatinine, Ser: 0.87 mg/dL (ref 0.44–1.00)
GFR calc Af Amer: >60 mL/min (ref >60)
GFR calc non Af Amer: >60 mL/min (ref >60)
Glucose, Bld: 107 mg/dL — ABNORMAL HIGH (ref 70–99)
Potassium: 3.9 mmol/L (ref 3.5–5.1)
Sodium: 140 mmol/L (ref 135–145)

## 2019-01-02 LAB — CBG MONITORING, ED: Glucose-Capillary: 84 mg/dL (ref 70–99)

## 2019-01-02 MED ORDER — SODIUM CHLORIDE 0.9% FLUSH
3.0000 mL | Freq: Once | INTRAVENOUS | Status: AC
  Administered 2019-01-02: 3 mL via INTRAVENOUS

## 2019-01-02 MED ORDER — ONDANSETRON 4 MG PO TBDP: 4.0000 mg | ORAL_TABLET | Freq: Three times a day (TID) | ORAL | 0 refills | Status: DC | PRN

## 2019-01-02 MED ORDER — IBUPROFEN 800 MG PO TABS
800.0000 mg | ORAL_TABLET | Freq: Once | ORAL | Status: AC
  Administered 2019-01-02: 15:00:00 800 mg via ORAL
  Filled 2019-01-02: qty 1

## 2019-01-02 NOTE — ED Notes (Addendum)
Pt made aware urine sample needed. Pt ambulated to restroom without assistance and denies pain or dizziness. Drinking small sips of water w/o probelm

## 2019-01-02 NOTE — ED Notes (Signed)
Pt verbalizes understanding of DC instructions. Pt belongings returned and is ambulatory out of ED.  

## 2019-01-02 NOTE — ED Provider Notes (Signed)
[facility] Provider Note   CSN: [id number] Arrival date & time: 01/02/19  1210     History   Chief Complaint Chief Complaint  Patient presents with  . Loss of Consciousness    HPI Tina Dickson is a 23 y.o. female.     The history is provided by the patient. No language interpreter was used.   Tina Dickson is a 23 y.o. female who presents to the Emergency Department complaining of syncope.  She passed out at work today.  Yesterday she had nausea and diarrhea and called out from work.  Today at work she became nauseous and vomited in the toilet. Just after she vomited she synopsized struck the floor. His coworker found her. Upon waking she had a headache. Overall her nausea is resolved. She describes the headache is throbbing. She does not describe it is the worst headache of her life. She denies any fevers, abdominal pain, dysuria, vaginally discharge. She has no personal or family history of aneurysm. No known COVID 19 exposures. Past Medical History:  Diagnosis Date  . Abdominal pain, recurrent    characterized as crampy, spasmodic pain  . Asthma    as a child- not since 2010  . Chronic constipation   . Eczema     Patient Active Problem List   Diagnosis Date Noted  . SVD (spontaneous vaginal delivery) 12/28/2016  . Postpartum care following vaginal delivery 12/28/2016  . Gestational hypertension w/o significant proteinuria in 3rd trimester 12/27/2016  . Dysmenorrhea in adolescent 04/17/2014  . Vaginal discharge 04/17/2014  . Allergic rhinitis 08/04/2013  . Unspecified vitamin D deficiency 05/07/2013  . 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 Chronic constipation   . ECZEMA 12/24/2006    Past Surgical History:  Procedure Laterality Date  . HERNIA REPAIR  1998     OB History    Gravida  1   Para  1   Term  1   Preterm  0   AB  0   Living  1     SAB  0   TAB  0   Ectopic  0   Multiple  0   Live Births              Home Medications    Prior to Admission medications   Medication Sig Start Date End Date Taking? Authorizing Provider  ondansetron (ZOFRAN ODT) 4 MG disintegrating tablet Take 1 tablet (4 mg total) by mouth every 8 (eight) hours as needed for nausea or vomiting. 01/02/19   Tilden Fossaees, Goldie Tregoning, MD  cetirizine (ZYRTEC) 10 MG tablet Take 1 tablet (10 mg total) by mouth daily for 10 days. Patient not taking: Reported on 01/18/2018 10/26/17 12/02/18  Isa RankinMurray, Laura Wilson, MD    Family History Family History  Problem Relation Age of Onset  . Asthma Brother   . Lactose intolerance Mother   . Asthma Mother   . Diabetes Father   . Cancer Maternal Grandmother   . Diabetes Paternal Grandfather   . Irritable bowel syndrome Neg Hx     Social History Social History   Tobacco Use  . Smoking status: Never Smoker  . Smokeless tobacco: Never Used  Substance Use Topics  . Alcohol use: No  . Drug use: No     Allergies   Patient has no known allergies.   Review of Systems Review of Systems  All other systems reviewed and are negative.    Physical Exam Updated Vital Signs BP  113/76   Pulse 66   Temp 98.5 F (36.9 C) (Oral)   Resp 16   Ht 5\' 3"  (1.6 m)   Wt 93.9 kg   LMP 01/02/2019   SpO2 100%   BMI 36.67 kg/m   Physical Exam Vitals signs and nursing note reviewed.  Constitutional:      Appearance: She is well-developed.  HENT:     Head: Normocephalic and atraumatic.  Eyes:     Extraocular Movements: Extraocular movements intact.     Pupils: Pupils are equal, round, and reactive to light.  Cardiovascular:     Rate and Rhythm: Normal rate and regular rhythm.     Heart sounds: No murmur.  Pulmonary:     Effort: Pulmonary effort is normal. No respiratory distress.     Breath sounds: Normal breath sounds.  Abdominal:     Palpations: Abdomen is soft.     Tenderness: There is no abdominal tenderness. There is no guarding or rebound.  Musculoskeletal:         General: No tenderness.  Skin:    General: Skin is warm and dry.  Neurological:     Mental Status: She is alert and oriented to person, place, and time.     Comments: Five out of five strength in all four extremities with sensation to light touch intact in all four extremities. No asymmetry of facial movements.  Psychiatric:        Behavior: Behavior normal.      ED Treatments / Results  Labs (all labs ordered are listed, but only abnormal results are displayed) Labs Reviewed  BASIC METABOLIC PANEL - Abnormal; Notable for the following components:      Result Value   Glucose, Bld 107 (*)    All other components within normal limits  URINALYSIS, ROUTINE W REFLEX MICROSCOPIC - Abnormal; Notable for the following components:   Color, Urine AMBER (*)    Hgb urine dipstick LARGE (*)    Ketones, ur 5 (*)    Protein, ur 100 (*)    Leukocytes,Ua TRACE (*)    RBC / HPF >50 (*)    Bacteria, UA RARE (*)    All other components within normal limits  CBC  CBG MONITORING, ED  I-STAT BETA HCG BLOOD, ED ([facility], [facility], [facility] ONLY)    EKG EKG Interpretation  Date/Time:  Thursday January 02 2019 12:55:06 EDT Ventricular Rate:  69 PR Interval:    QRS Duration: 84 QT Interval:  385 QTC Calculation: 413 R Axis:   62 Text Interpretation:  Sinus rhythm Low voltage, precordial leads no prior available for comparison Confirmed by 02-12-2002 [phone]) on 01/02/2019 1:40:23 PM   Radiology No results found.  Procedures Procedures (including critical care time)  Medications Ordered in ED Medications  sodium chloride flush (NS) 0.9 % injection 3 mL (3 mLs Intravenous Given 01/02/19 1444)  ibuprofen (ADVIL) tablet 800 mg (800 mg Oral Given 01/02/19 1459)     Initial Impression / Assessment and Plan / ED Course  I have reviewed the triage vital signs and the nursing notes.  Pertinent labs & imaging results that were available during my care of the patient were reviewed by me and considered in my  medical decision making (see chart for details).        Patient here for evaluation after a syncopal episode, did strike her head. She is non-toxic appearing on evaluation with no focal neurologic deficits. She did have a headache on ED arrival but improved  with ibuprofen. Presentation is not consistent with subarachnoid hemorrhage, PE, sepsis. Patient with likely vagal episode secondary to emesis. Urinalysis with RBC is present but patient is currently on her cycle. UA is not consistent with UTI. Discussed with patient home care for nausea, vomiting, diarrhea and syncope. Discussed outpatient follow-up and return precautions.  Final Clinical Impressions(s) / ED Diagnoses   Final diagnoses:  Syncope and collapse  Contusion of other part of head, initial encounter    ED Discharge Orders         Ordered    ondansetron (ZOFRAN ODT) 4 MG disintegrating tablet  Every 8 hours PRN     01/02/19 1416           Quintella Reichert, MD 01/02/19 1611

## 2019-01-02 NOTE — ED Triage Notes (Signed)
Pt reports that she she was nauseated and having diarrhea that started yesterday. Pt reports that she went to work today, and had an episode of emesis and passed put. Pt states that she is unsure how long she was unconscious. Pt is unsure if she hit head but does report that she has HA in which she did not have prior and some back of the head tenderness. Pt does report that she had a similar incident in the past and was fnd to be dehydrated.    *Pt does report that she has vaginal bleeding for a month and a half , stated d/t her implant/

## 2019-03-17 ENCOUNTER — Emergency Department (HOSPITAL_COMMUNITY)
Admission: EM | Admit: 2019-03-17 | Discharge: 2019-03-17 | Disposition: A | Discharge status: Home / Self Care | Payer: BC Managed Care – PPO | Attending: Emergency Medicine | Admitting: Emergency Medicine

## 2019-03-17 ENCOUNTER — Other Ambulatory Visit: Payer: Self-pay

## 2019-03-17 DIAGNOSIS — Z20828 Contact with and (suspected) exposure to other viral communicable diseases: Secondary | ICD-10-CM | POA: Insufficient documentation

## 2019-03-17 DIAGNOSIS — R55 Syncope and collapse: Secondary | ICD-10-CM | POA: Insufficient documentation

## 2019-03-17 DIAGNOSIS — Z79899 Other long term (current) drug therapy: Secondary | ICD-10-CM | POA: Diagnosis not present

## 2019-03-17 DIAGNOSIS — J45909 Unspecified asthma, uncomplicated: Secondary | ICD-10-CM | POA: Diagnosis not present

## 2019-03-17 LAB — URINALYSIS, ROUTINE W REFLEX MICROSCOPIC
Bilirubin Urine: NEGATIVE
Glucose, UA: NEGATIVE mg/dL
Ketones, ur: NEGATIVE mg/dL
Nitrite: NEGATIVE
Protein, ur: NEGATIVE mg/dL
Specific Gravity, Urine: 1.006 (ref 1.005–1.030)
pH: 5 (ref 5.0–8.0)

## 2019-03-17 LAB — CBC WITH DIFFERENTIAL/PLATELET
Abs Immature Granulocytes: 0.01 10*3/uL (ref 0.00–0.07)
Basophils Absolute: 0 10*3/uL (ref 0.0–0.1)
Basophils Relative: 1 %
Eosinophils Absolute: 0.1 10*3/uL (ref 0.0–0.5)
Eosinophils Relative: 2 %
HCT: 40.6 % (ref 36.0–46.0)
Hemoglobin: 13.1 g/dL (ref 12.0–15.0)
Immature Granulocytes: 0 %
Lymphocytes Relative: 39 %
Lymphs Abs: 2.1 10*3/uL (ref 0.7–4.0)
MCH: 27.7 pg (ref 26.0–34.0)
MCHC: 32.3 g/dL (ref 30.0–36.0)
MCV: 85.8 fL (ref 80.0–100.0)
Monocytes Absolute: 0.5 10*3/uL (ref 0.1–1.0)
Monocytes Relative: 9 %
Neutro Abs: 2.7 10*3/uL (ref 1.7–7.7)
Neutrophils Relative %: 49 %
Platelets: 212 10*3/uL (ref 150–400)
RBC: 4.73 MIL/uL (ref 3.87–5.11)
RDW: 12.6 % (ref 11.5–15.5)
WBC: 5.5 10*3/uL (ref 4.0–10.5)
nRBC: 0 % (ref 0.0–0.2)

## 2019-03-17 LAB — BASIC METABOLIC PANEL
Anion gap: 7 (ref 5–15)
BUN: 9 mg/dL (ref 6–20)
CO2: 25 mmol/L (ref 22–32)
Calcium: 9.1 mg/dL (ref 8.9–10.3)
Chloride: 108 mmol/L (ref 98–111)
Creatinine, Ser: 0.89 mg/dL (ref 0.44–1.00)
GFR calc Af Amer: >60 mL/min (ref >60)
GFR calc non Af Amer: >60 mL/min (ref >60)
Glucose, Bld: 92 mg/dL (ref 70–99)
Potassium: 4 mmol/L (ref 3.5–5.1)
Sodium: 140 mmol/L (ref 135–145)

## 2019-03-17 LAB — I-STAT BETA HCG BLOOD, ED (MC, WL, AP ONLY): I-stat hCG, quantitative: <5 m[IU]/mL (ref <5)

## 2019-03-17 LAB — SARS CORONAVIRUS 2 (TAT 6-24 HRS): SARS Coronavirus 2: NEGATIVE

## 2019-03-17 MED ORDER — SODIUM CHLORIDE 0.9 % IV BOLUS (SEPSIS)
1000.0000 mL | Freq: Once | INTRAVENOUS | Status: AC
  Administered 2019-03-17: 05:00:00 1000 mL via INTRAVENOUS

## 2019-03-17 NOTE — ED Provider Notes (Signed)
[facility]-EMERGENCY DEPT Provider Note   CSN: [id number] Arrival date & time: 03/17/19  0256     History Chief Complaint  Patient presents with  . Loss of Consciousness  . Nausea    Tina Dickson is a 23 y.o. female.  The history is provided by the patient.  Loss of Consciousness Episode history:  Single Most recent episode:  Today Timing:  Constant Progression:  Resolved Chronicity:  New Witnessed: yes   Relieved by:  None tried Worsened by:  Nothing Associated symptoms: nausea and vomiting   Associated symptoms: no chest pain, no fever, no seizures and no shortness of breath   Patient presents from home via EMS.  Patient reports she woke up tonight feeling dizzy and vomited.  Her husband told her that she passed out briefly.  No seizures.  She has had syncope before she still reports continued dizziness.  She does report exposure to a student with COVID-19 several days ago.  Patient reports she tested negative 2 days ago     Past Medical History:  Diagnosis Date  . Abdominal pain, recurrent    characterized as crampy, spasmodic pain  . Asthma    as a child- not since 2010  . Chronic constipation   . Eczema     Patient Active Problem List   Diagnosis Date Noted  . SVD (spontaneous vaginal delivery) 12/28/2016  . Postpartum care following vaginal delivery 12/28/2016  . Gestational hypertension w/o significant proteinuria in 3rd trimester 12/27/2016  . Dysmenorrhea in adolescent 04/17/2014  . Vaginal discharge 04/17/2014  . Allergic rhinitis 08/04/2013  . Unspecified vitamin D deficiency 05/07/2013  . 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 Chronic constipation   . ECZEMA 12/24/2006    Past Surgical History:  Procedure Laterality Date  . HERNIA REPAIR  1998     OB History    Gravida  1   Para  1   Term  1   Preterm  0   AB  0   Living  1     SAB  0   TAB  0   Ectopic  0   Multiple  0   Live Births                Family History  Problem Relation Age of Onset  . Asthma Brother   . Lactose intolerance Mother   . Asthma Mother   . Diabetes Father   . Cancer Maternal Grandmother   . Diabetes Paternal Grandfather   . Irritable bowel syndrome Neg Hx     Social History   Tobacco Use  . Smoking status: Never Smoker  . Smokeless tobacco: Never Used  Substance Use Topics  . Alcohol use: No  . Drug use: No    Home Medications Prior to Admission medications   Medication Sig Start Date End Date Taking? Authorizing Provider  LESSINA-28 0.1-20 MG-MCG tablet Take 1 tablet by mouth daily. 02/05/19  Yes [provider]  cetirizine (ZYRTEC) 10 MG tablet Take 1 tablet (10 mg total) by mouth daily for 10 days. Patient not taking: Reported on 01/18/2018 10/26/17 12/02/18  Isa Rankin, MD    Allergies    Patient has no known allergies.  Review of Systems   Review of Systems  Constitutional: Negative for fever.  Respiratory: Negative for cough and shortness of breath.   Cardiovascular: Positive for syncope. Negative for chest pain.  Gastrointestinal: Positive for nausea and vomiting.  Neurological: Positive for syncope. Negative  for seizures.  All other systems reviewed and are negative.   Physical Exam Updated Vital Signs BP 124/76 (BP Location: Right Arm)   Pulse 63   Temp 98.2 F (36.8 C) (Oral)   Resp 13   SpO2 100%   Physical Exam CONSTITUTIONAL: Well developed/well nourished HEAD: Normocephalic/atraumatic EYES: EOMI ENMT: Mask in place NECK: supple no meningeal signs SPINE/BACK:entire spine nontender CV: S1/S2 noted, no murmurs/rubs/gallops noted LUNGS: Lungs are clear to auscultation bilaterally, no apparent distress ABDOMEN: soft, nontender, no rebound or guarding, bowel sounds noted throughout abdomen GU:no cva tenderness NEURO: Pt is awake/alert/appropriate, moves all extremitiesx4.  No facial droop.  No arm or leg drift is noted  patient can  ambulate EXTREMITIES: pulses normal/equal, full ROM SKIN: warm, color normal PSYCH: no abnormalities of mood noted, alert and oriented to situation  ED Results / Procedures / Treatments   Labs (all labs ordered are listed, but only abnormal results are displayed) Labs Reviewed  URINALYSIS, ROUTINE W REFLEX MICROSCOPIC - Abnormal; Notable for the following components:      Result Value   APPearance CLOUDY (*)    Hgb urine dipstick SMALL (*)    Leukocytes,Ua LARGE (*)    Bacteria, UA FEW (*)    All other components within normal limits  SARS CORONAVIRUS 2 (TAT 6-24 HRS)  BASIC METABOLIC PANEL  CBC WITH DIFFERENTIAL/PLATELET  I-STAT BETA HCG BLOOD, ED ([facility], [facility], [facility] ONLY)    EKG EKG Interpretation  Date/Time:  Monday March 17 2019 03:15:26 EST Ventricular Rate:  69 PR Interval:    QRS Duration: 95 QT Interval:  397 QTC Calculation: 426 R Axis:   72 Text Interpretation: Sinus rhythm Low voltage, precordial leads No significant change since last tracing Confirmed by Ripley Fraise [id number]) on 03/17/2019 4:59:43 AM   Radiology No results found.  Procedures Procedures   Medications Ordered in ED Medications  sodium chloride 0.9 % bolus 1,000 mL (1,000 mLs Intravenous New Bag/Given 03/17/19 0453)    ED Course  I have reviewed the triage vital signs and the nursing notes.  Pertinent labs  results that were available during my care of the patient were reviewed by me and considered in my medical decision making (see chart for details).    MDM Rules/Calculators/A&P                       Patient presents for syncopal episode.  She is now back to baseline.  No EKG changes.  This is her second episode in the past 2 months Will refer her to cardiology.  Labs are pending at this time 6:22 AM Pt improved No distress Pt able to ambulate Will d/c home Refer to cardiology Discussed strategies to avoid syncope We discussed strict ER return precautions  Final Clinical  Impression(s) / ED Diagnoses Final diagnoses:  Syncope and collapse    Rx / DC Orders ED Discharge Orders    None       Ripley Fraise, MD 03/17/19 [phone]

## 2019-03-17 NOTE — ED Triage Notes (Signed)
23 yo female BIB GEMS from home for syncopal episode. 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 Pt had rapid test done Saturday that resulted negative. This morning at 1 am pt states she was awakened out of her sleep with nausea. She had one episode of emesis. After vomiting pt reports feeling dizzy and layed down on the floor but doesn't remember anything after that aside from being awakened by her significant other. Pt states dizziness is worse upon standing. Orthostatics negative as per EMS.  Vitals: bp 146/90 Hr 74 rr 14 Temp 97.8 cbg 99 spo2 99 on room air   20g in left AC pt received 250 ml of fluids en route

## 2019-03-18 NOTE — Progress Notes (Signed)
Cardiology Office Note:    Date:  03/19/2019   ID:  Tina Dickson, DOB [birth year], MRN [id number]  PCP:  Lenord Fellers, PA-C  Cardiologist:  No primary care provider on file.  Electrophysiologist:  None   Referring MD: Lenord Fellers, PA-C   Chief Complaint  Patient presents with  . New Patient (Initial Visit)  . Chest Pain  . Loss of Consciousness    History of Present Illness:    Tina Dickson is a 23 y.o. female with no significant past medical history who is referred for an evaluation of syncope.  She reports that she has had 4 syncopal episodes.  The first occurred when she had her wisdom teeth taken out in high school.  She has then had 3 syncopal episodes this year.  First occurred in July, second in October, and third episode occurred this week.  During each episode,  she described similar symptoms.  States that she will feel dizzy like the room is spinning, and then gets tunnel vision and headache, and will then feel nauseous and vomit, then will pass out.  States she is usually unconscious for short time.  She is not sure what triggered these episodes, but does note they have each occurred during the time when she has been very stressed.  She does note that she feels her heart is racing before she passes out.  She was seen in the [facility], ED on 03/17/2019 following her most recent syncopal episode.   In the ED, EKG, labs, Covid test were unremarkable.  She states that after she got home from the hospital she had an episode of chest pain.  Reports that she felt pressure in the left side of her chest, lasted 7 to 10 minutes and resolved.  She never had chest pain prior to this.  States that she exercises by walking up and down the 20 stairs apartment complex.  She denies any exertional chest pain.  No smoking history.  Reports mother had possible MI in 86s.    Past Medical History:  Diagnosis Date  . Abdominal pain, recurrent    characterized as crampy, spasmodic pain    . Asthma    as a child- not since 2010  . Chronic constipation   . Eczema     Past Surgical History:  Procedure Laterality Date  . HERNIA REPAIR  1998    Current Medications: No outpatient medications have been marked as taking for the 03/19/19 encounter (Office Visit) with Little Ishikawa, MD.     Allergies:   Patient has no known allergies.   Social History   Socioeconomic History  . Marital status: Married    Spouse name: Not on file  . Number of children: Not on file  . Years of education: Not on file  . Highest education level: Not on file  Occupational History  . Not on file  Tobacco Use  . Smoking status: Never Smoker  . Smokeless tobacco: Never Used  Substance and Sexual Activity  . Alcohol use: No  . Drug use: No  . Sexual activity: Not Currently    Birth control/protection: Implant    Comment: placed Jan 2019  Other Topics Concern  . Not on file  Social History Narrative   Lives with Mom and brother together with grandmother and her husband.   Social Determinants of Health   Financial Resource Strain:   . Difficulty of Paying Living Expenses: Not on file  Food Insecurity:   .  Worried About Programme researcher, broadcasting/film/videounning Out of Food in the Last Year: Not on file  . Ran Out of Food in the Last Year: Not on file  Transportation Needs:   . Lack of Transportation (Medical): Not on file  . Lack of Transportation (Non-Medical): Not on file  Physical Activity:   . Days of Exercise per Week: Not on file  . Minutes of Exercise per Session: Not on file  Stress:   . Feeling of Stress : Not on file  Social Connections:   . Frequency of Communication with Friends and Family: Not on file  . Frequency of Social Gatherings with Friends and Family: Not on file  . Attends Religious Services: Not on file  . Active Member of Clubs or Organizations: Not on file  . Attends BankerClub or Organization Meetings: Not on file  . Marital Status: Not on file     Family History: The  patient's family history includes Asthma in her brother and mother; Cancer in her maternal grandmother; Diabetes in her father and paternal grandfather; Lactose intolerance in her mother. There is no history of Irritable bowel syndrome.  ROS:   Please see the history of present illness.     All other systems reviewed and are negative.  EKGs/Labs/Other Studies Reviewed:    The following studies were reviewed today:   EKG:  EKG is  ordered today.  The ekg ordered today demonstrates normal sinus rhythm, rate 73, no ST/T abnormalities  Recent Labs: 03/17/2019: BUN 9; Creatinine, Ser 0.89; Hemoglobin 13.1; Platelets 212; Potassium 4.0; Sodium 140  Recent Lipid Panel    Component Value Date/Time   CHOL 151 04/15/2013 1527   TRIG 69 04/15/2013 1527   HDL 46 04/15/2013 1527   CHOLHDL 3.3 04/15/2013 1527   VLDL 14 04/15/2013 1527   LDLCALC 91 04/15/2013 1527    Physical Exam:    VS:  BP (!) 110/58 (BP Location: Right Arm, Patient Position: Sitting, Cuff Size: Normal)   Pulse 73   Temp (!) 97.5 F (36.4 C)   Ht 5\' 2"  (1.575 m)   Wt 203 lb (92.1 kg)   BMI 37.13 kg/m     Wt Readings from Last 3 Encounters:  03/19/19 203 lb (92.1 kg)  01/02/19 207 lb (93.9 kg)  01/18/18 201 lb 15.1 oz (91.6 kg)     GEN:  Well nourished, well developed in no acute distress HEENT: Normal NECK: No JVD LYMPHATICS: No lymphadenopathy CARDIAC: RRR, no murmurs, rubs, gallops RESPIRATORY:  Clear to auscultation without rales, wheezing or rhonchi  ABDOMEN: Soft, non-tender, non-distended MUSCULOSKELETAL:  No edema; No deformity  SKIN: Warm and dry NEUROLOGIC:  Alert and oriented x 3 PSYCHIATRIC:  Normal affect   ASSESSMENT:    1. Syncope and collapse   2. Chest pain of uncertain etiology    PLAN:    In order of problems listed above:  Syncope: Description suggests vasovagal syncope, as describes prodromal symptoms preceding episode of syncope.  Will check TTE to rule out structural heart  disease.  Does report palpitations during episodes, will check 30 day monitor to evaluate for arrhythmia.  - I did discuss the [place] DMV medical guidelines for driving: "it is prudent to recommend that all persons should be free of syncopal episodes for at least six months to be granted the driving privilege." (THE [place] PHYSICIAN'S GUIDE TO DRIVER MEDICAL EVALUATION, Second Edition, Medical Review Branch, Associate ProfessorDriver License Section, Division of [employer], [employer] Department of Transportation, July 2004)  Chest pain: Atypical, as describes left-sided pressure that occurred at rest.  No exertional symptoms.  Given age and lack of risk factors, no further work-up recommended at this time  RTC in 3 months    Medication Adjustments/Labs and Tests Ordered: Current medicines are reviewed at length with the patient today.  Concerns regarding medicines are outlined above.  Orders Placed This Encounter  Procedures  . Cardiac event monitor  . EKG 12-Lead  . ECHOCARDIOGRAM COMPLETE   No orders of the defined types were placed in this encounter.   Patient Instructions  Medication Instructions: NO CHANGES *If you need a refill on your cardiac medications before your next appointment, please call your pharmacy*  Lab Work:  Testing/Procedures: [place], [place] Your physician has requested that you have an echocardiogram. Echocardiography is a painless test that uses sound waves to create images of your heart. It provides your doctor with information about the size and shape of your heart and how well your heart's chambers and valves are working. This procedure takes approximately one hour. There are no restrictions for this procedure.  Your physician has recommended that you wear an event monitor. Event monitors are medical devices that record the heart's electrical activity. Doctors most often [place] these monitors to diagnose arrhythmias. Arrhythmias are problems with the  speed or rhythm of the heartbeat. The monitor is a small, portable device. You can wear one while you do your normal daily activities. This is usually used to diagnose what is causing palpitations/syncope (passing out). YOU WILL RECEIVE A CALL TO HAVE MONITOR SENT TO YOU.     Follow-Up: At [facility], you and your health needs are our priority.  As part of our continuing mission to provide you with exceptional heart care, we have created designated Provider Care Teams.  These Care Teams include your primary Cardiologist (physician) and Advanced Practice Providers (APPs -  Physician Assistants and Nurse Practitioners) who all work together to provide you with the care you need, when you need it.  Your next appointment:   3 month(s)  The format for your next appointment:   Either In Person or Virtual  Provider:   Oswaldo Milian, MD  Other Instructions      Signed, Donato Heinz, MD  03/19/2019 9:54 AM    [place]

## 2019-03-19 ENCOUNTER — Telehealth: Payer: Self-pay | Admitting: Radiology

## 2019-03-19 ENCOUNTER — Other Ambulatory Visit: Payer: Self-pay

## 2019-03-19 ENCOUNTER — Ambulatory Visit (INDEPENDENT_AMBULATORY_CARE_PROVIDER_SITE_OTHER): Payer: BC Managed Care – PPO | Admitting: Cardiology

## 2019-03-19 ENCOUNTER — Encounter: Payer: Self-pay | Admitting: Cardiology

## 2019-03-19 VITALS — BP 110/58 | HR 73 | Temp 97.5°F | Ht 62.0 in | Wt 203.0 lb

## 2019-03-19 DIAGNOSIS — R55 Syncope and collapse: Secondary | ICD-10-CM | POA: Diagnosis not present

## 2019-03-19 DIAGNOSIS — R079 Chest pain, unspecified: Secondary | ICD-10-CM

## 2019-03-19 LAB — URINE CULTURE

## 2019-03-19 NOTE — Patient Instructions (Signed)
Medication Instructions: NO CHANGES *If you need a refill on your cardiac medications before your next appointment, please call your pharmacy*  Lab Work:  Testing/Procedures: [place], [place] has requested that you have an echocardiogram. Echocardiography is a painless test that uses sound waves to create images of your heart. It provides your doctor with information about the size and shape of your heart and how well your heart's chambers and valves are working. This procedure takes approximately one hour. There are no restrictions for this procedure.  Your physician has recommended that you wear an event monitor. Event monitors are medical devices that record the heart's electrical activity. Doctors most often [place] these monitors to diagnose arrhythmias. Arrhythmias are problems with the speed or rhythm of the heartbeat. The monitor is a small, portable device. You can wear one while you do your normal daily activities. This is usually used to diagnose what is causing palpitations/syncope (passing out). YOU WILL RECEIVE A CALL TO HAVE MONITOR SENT TO YOU.     Follow-Up: At [facility], you and your health needs are our priority.  As part of our continuing mission to provide you with exceptional heart care, we have created designated Provider Care Teams.  These Care Teams include your primary Cardiologist (physician) and Advanced Practice Providers (APPs -  Physician Assistants and Nurse Practitioners) who all work together to provide you with the care you need, when you need it.  Your next appointment:   3 month(s)  The format for your next appointment:   Either In Person or Virtual  Provider:   Oswaldo Milian, MD  Other Instructions

## 2019-03-19 NOTE — Telephone Encounter (Signed)
Enrolled patient for a 30 day [employer] Event monitor to be mailed to patients home.  

## 2019-03-24 ENCOUNTER — Ambulatory Visit (INDEPENDENT_AMBULATORY_CARE_PROVIDER_SITE_OTHER): Payer: BC Managed Care – PPO

## 2019-03-24 DIAGNOSIS — R55 Syncope and collapse: Secondary | ICD-10-CM

## 2019-03-27 ENCOUNTER — Ambulatory Visit (HOSPITAL_COMMUNITY): Payer: BC Managed Care – PPO | Attending: Cardiology

## 2019-04-03 ENCOUNTER — Other Ambulatory Visit (HOSPITAL_COMMUNITY): Payer: BC Managed Care – PPO

## 2019-04-12 ENCOUNTER — Ambulatory Visit
Admission: EM | Admit: 2019-04-12 | Discharge: 2019-04-12 | Disposition: A | Discharge status: Home / Self Care | Payer: BC Managed Care – PPO | Attending: Physician Assistant | Admitting: Physician Assistant

## 2019-04-12 ENCOUNTER — Other Ambulatory Visit: Payer: Self-pay

## 2019-04-12 ENCOUNTER — Encounter: Payer: Self-pay | Admitting: Emergency Medicine

## 2019-04-12 DIAGNOSIS — Z20822 Contact with and (suspected) exposure to covid-19: Secondary | ICD-10-CM | POA: Diagnosis not present

## 2019-04-12 DIAGNOSIS — J039 Acute tonsillitis, unspecified: Secondary | ICD-10-CM | POA: Insufficient documentation

## 2019-04-12 DIAGNOSIS — J029 Acute pharyngitis, unspecified: Secondary | ICD-10-CM | POA: Diagnosis not present

## 2019-04-12 LAB — POCT RAPID STREP A (OFFICE): Rapid Strep A Screen: NEGATIVE

## 2019-04-12 MED ORDER — FLUCONAZOLE 150 MG PO TABS: 150.0000 mg | ORAL_TABLET | Freq: Every day | ORAL | 0 refills | Status: DC

## 2019-04-12 MED ORDER — AMOXICILLIN 500 MG PO CAPS: 500.0000 mg | ORAL_CAPSULE | Freq: Two times a day (BID) | ORAL | 0 refills | Status: DC

## 2019-04-12 NOTE — ED Notes (Signed)
Patient able to ambulate independently  

## 2019-04-12 NOTE — ED Provider Notes (Signed)
[facility]    CSN: [id number] Arrival date & time: 04/12/19  0831      History   Chief Complaint Chief Complaint  Patient presents with  . Sore Throat    HPI Tina Dickson is a 24 y.o. female.   24 year old female comes in for 2 day of sore throat.  Denies rhinorrhea, nasal congestion, cough.  Has history of frequent strep, and states feels the same.  Painful swallowing without trouble breathing, swelling of the throat, tripoding, drooling, trismus. Denies fever, chills, body aches. Denies abdominal pain, nausea, vomiting, diarrhea. Denies shortness of breath, loss of taste/smell. No obvious sick contact.      Past Medical History:  Diagnosis Date  . Abdominal pain, recurrent    characterized as crampy, spasmodic pain  . Asthma    as a child- not since 2010  . Chronic constipation   . Eczema     Patient Active Problem List   Diagnosis Date Noted  . SVD (spontaneous vaginal delivery) 12/28/2016  . Postpartum care following vaginal delivery 12/28/2016  . Gestational hypertension w/o significant proteinuria in 3rd trimester 12/27/2016  . Dysmenorrhea in adolescent 04/17/2014  . Vaginal discharge 04/17/2014  . Allergic rhinitis 08/04/2013  . Unspecified vitamin D deficiency 05/07/2013  . 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 Chronic constipation   . ECZEMA 12/24/2006    Past Surgical History:  Procedure Laterality Date  . HERNIA REPAIR  1998    OB History    Gravida  1   Para  1   Term  1   Preterm  0   AB  0   Living  1     SAB  0   TAB  0   Ectopic  0   Multiple  0   Live Births               Home Medications    Prior to Admission medications   Medication Sig Start Date End Date Taking? Authorizing Provider  amoxicillin (AMOXIL) 500 MG capsule Take 1 capsule (500 mg total) by mouth 2 (two) times daily. 04/12/19   Cathie Hoops, Harjot Zavadil V, PA-C  fluconazole (DIFLUCAN) 150 MG tablet Take 1 tablet (150 mg total) by mouth daily. Take  second dose 72 hours later if symptoms still persists. 04/12/19   Avon Mergenthaler V, PA-C  LESSINA-28 0.1-20 MG-MCG tablet Take 1 tablet by mouth daily. 02/05/19   [provider]  cetirizine (ZYRTEC) 10 MG tablet Take 1 tablet (10 mg total) by mouth daily for 10 days. Patient not taking: Reported on 01/18/2018 10/26/17 12/02/18  Isa Rankin, MD    Family History Family History  Problem Relation Age of Onset  . Asthma Brother   . Lactose intolerance Mother   . Asthma Mother   . Diabetes Father   . Cancer Maternal Grandmother   . Diabetes Paternal Grandfather   . Irritable bowel syndrome Neg Hx     Social History Social History   Tobacco Use  . Smoking status: Never Smoker  . Smokeless tobacco: Never Used  Substance Use Topics  . Alcohol use: No  . Drug use: No     Allergies   Patient has no known allergies.   Review of Systems Review of Systems  Reason unable to perform ROS: See HPI as above.     Physical Exam Triage Vital Signs ED Triage Vitals  Enc Vitals Group     BP 04/12/19 0842 122/79  Pulse Rate 04/12/19 0842 76     Resp 04/12/19 0842 16     Temp 04/12/19 0842 97.8 F (36.6 C)     Temp Source 04/12/19 0842 Temporal     SpO2 04/12/19 0842 99 %     Weight --      Height --      Head Circumference --      Peak Flow --      Pain Score 04/12/19 0843 8     Pain Loc --      Pain Edu? --      Excl. in [place]? --    No data found.  Updated Vital Signs BP 122/79 (BP Location: Left Arm)   Pulse 76   Temp 97.8 F (36.6 C) (Temporal)   Resp 16   LMP 04/01/2019   SpO2 99%   Physical Exam Constitutional:      General: She is not in acute distress.    Appearance: Normal appearance. She is not ill-appearing, toxic-appearing or diaphoretic.  HENT:     Head: Normocephalic and atraumatic.     Mouth/Throat:     Mouth: Mucous membranes are moist.     Pharynx: Oropharynx is clear. Uvula midline.     Tonsils: Tonsillar exudate present. 2+ on the  right. 2+ on the left.  Cardiovascular:     Rate and Rhythm: Normal rate and regular rhythm.     Heart sounds: Normal heart sounds. No murmur. No friction rub. No gallop.   Pulmonary:     Effort: Pulmonary effort is normal. No accessory muscle usage, prolonged expiration, respiratory distress or retractions.     Comments: Lungs clear to auscultation without adventitious lung sounds. Musculoskeletal:     Cervical back: Normal range of motion and neck supple.  Neurological:     General: No focal deficit present.     Mental Status: She is alert and oriented to person, place, and time.      UC Treatments / Results  Labs (all labs ordered are listed, but only abnormal results are displayed) Labs Reviewed  NOVEL CORONAVIRUS, NAA  CULTURE, GROUP A STREP [facility])  POCT RAPID STREP A (OFFICE)    EKG   Radiology No results found.  Procedures Procedures (including critical care time)  Medications Ordered in UC Medications - No data to display  Initial Impression / Assessment and Plan / UC Course  I have reviewed the triage vital signs and the nursing notes.  Pertinent labs & imaging results that were available during my care of the patient were reviewed by me and considered in my medical decision making (see chart for details).    Rapid strep negative.  However, given history and exam, will cover for tonsillitis with amoxicillin. Discussed this cannot rule out COVID, COVID testing ordered. Patient to quarantine until testing results return. Other symptomatic treatment discussed.  Return precautions given.  Patient expresses understanding and agrees to plan.  Final Clinical Impressions(s) / UC Diagnoses   Final diagnoses:  Sore throat  Acute tonsillitis, unspecified etiology   ED Prescriptions    Medication Sig Dispense Auth. Provider   amoxicillin (AMOXIL) 500 MG capsule Take 1 capsule (500 mg total) by mouth 2 (two) times daily. 20 capsule Vallorie Niccoli V, PA-C   fluconazole  (DIFLUCAN) 150 MG tablet Take 1 tablet (150 mg total) by mouth daily. Take second dose 72 hours later if symptoms still persists. 2 tablet Ok Edwards, PA-C     PDMP not reviewed this encounter.  Belinda Fisher, PA-C 04/12/19 [phone]

## 2019-04-12 NOTE — ED Triage Notes (Addendum)
Pt presents to [facility] for assessment of sore throat x 24 hours.  States yesterday it felt tight, but today it began feeling sore.  Denies nasal congestion, denies cough, denies dental pain.  Ibuprofen last night, nothing today.

## 2019-04-12 NOTE — Discharge Instructions (Signed)
Rapid strep negative. However, given your exam, will cover you empirically for bacterial infection with amoxicillin. As discussed, symptoms can still be due to viral illness/ drainage down your throat. COVID testing ordered. Please quarantine until testing results return. You can take over the counter allergy medicine such as Flonase, Zyrtec-D if develop nasal congestion/drainage. You can use over the counter nasal saline rinse such as neti pot for nasal congestion. Monitor for any worsening of symptoms, swelling of the throat, trouble breathing, trouble swallowing, leaning forward to breath, drooling, go to the emergency department for further evaluation needed.

## 2019-04-13 LAB — NOVEL CORONAVIRUS, NAA: SARS-CoV-2, NAA: NOT DETECTED

## 2019-04-15 ENCOUNTER — Encounter: Payer: Self-pay | Admitting: Cardiology

## 2019-04-16 LAB — CULTURE, GROUP A STREP (THRC)

## 2019-05-01 ENCOUNTER — Telehealth (HOSPITAL_COMMUNITY): Payer: Self-pay | Admitting: Radiology

## 2019-05-01 NOTE — Telephone Encounter (Signed)
NOTED ./CY 

## 2019-05-01 NOTE — Telephone Encounter (Signed)
Just an FYI. We have made several attempts to contact this patient including sending a letter to schedule or reschedule their echocardiogram. We will be removing the patient from the echo WQ. 04-15-19 mailbox full; letter mailed.dp  [email or web address]  mail box full unable to leave message evd  12/24 NO show for echocardiogram evd  Thank you

## 2019-05-31 ENCOUNTER — Ambulatory Visit: Payer: BC Managed Care – PPO | Attending: Internal Medicine

## 2019-05-31 DIAGNOSIS — Z23 Encounter for immunization: Secondary | ICD-10-CM | POA: Insufficient documentation

## 2019-05-31 NOTE — Progress Notes (Signed)
   Covid-19 Vaccination Clinic  Name:  Tina Dickson    MRN: [id number] DOB: [birth year]  05/31/2019  Ms. Tina Dickson was observed post Covid-19 immunization for 15 minutes without incidence. She was provided with Vaccine Information Sheet and instruction to access the V-Safe system.   Ms. Tina Dickson was instructed to call 911 with any severe reactions post vaccine: Marland Kitchen Difficulty breathing  . Swelling of your face and throat  . A fast heartbeat  . A bad rash all over your body  . Dizziness and weakness    Immunizations Administered    Name Date Dose VIS Date Route   Pfizer COVID-19 Vaccine 05/31/2019 10:17 AM 0.3 mL 03/14/2019 Intramuscular   Manufacturer: [employer], [employer]   Lot: [id number]   NDC: [address]-5

## 2019-06-15 NOTE — Progress Notes (Deleted)
Cardiology Office Note:    Date:  06/15/2019   ID:  BIANNEY Dickson, DOB Mar 27, 1996, MRN [id number]  PCP:  Salvatore Marvel, PA-C  Cardiologist:  No primary care provider on file.  Electrophysiologist:  None   Referring MD: Salvatore Marvel, PA-C   No chief complaint on file.   History of Present Illness:    Tina Dickson is a 24 y.o. female with no significant past medical history who presents for follow-up.  She was referred for an evaluation of syncope, initially seen on 03/19/2019.  She reports that she has had 4 syncopal episodes.  The first occurred when she had her wisdom teeth taken out in high school.  She has then had 3 syncopal episodes in 2020.  First occurred in July, second in October, and third episode occurred in December.  During each episode,  she described similar symptoms.  States that she will feel dizzy like the room is spinning, and then gets tunnel vision and headache, and will then feel nauseous and vomit, then will pass out.  States she is usually unconscious for short time.  She is not sure what triggered these episodes, but does note they have each occurred during the time when she has been very stressed.  She does note that she feels her heart is racing before she passes out.  She was seen in the [facility] ED on 03/17/2019 following her most recent syncopal episode.   In the ED, EKG, labs, Covid test were unremarkable.  She states that after she got home from the hospital she had an episode of chest pain.  Reports that she felt pressure in the left side of her chest, lasted 7 to 10 minutes and resolved.  She never had chest pain prior to this.  States that she exercises by walking up and down the 20 stairs apartment complex.  She denies any exertional chest pain.  No smoking history.  Reports mother had possible MI in 27s.  Cardiac monitor on 04/30/2019 showed no significant abnormalities.  TTE was ordered but has not been done.   Past Medical History:  Diagnosis  Date  . Abdominal pain, recurrent    characterized as crampy, spasmodic pain  . Asthma    as a child- not since 2010  . Chronic constipation   . Eczema     Past Surgical History:  Procedure Laterality Date  . HERNIA REPAIR  1998    Current Medications: No outpatient medications have been marked as taking for the 06/17/19 encounter (Appointment) with Donato Heinz, MD.     Allergies:   Patient has no known allergies.   Social History   Socioeconomic History  . Marital status: Married    Spouse name: Not on file  . Number of children: Not on file  . Years of education: Not on file  . Highest education level: Not on file  Occupational History  . Not on file  Tobacco Use  . Smoking status: Never Smoker  . Smokeless tobacco: Never Used  Substance and Sexual Activity  . Alcohol use: No  . Drug use: No  . Sexual activity: Not Currently    Birth control/protection: Implant    Comment: placed Jan 2019  Other Topics Concern  . Not on file  Social History Narrative   Lives with Mom and brother together with grandmother and her husband.   Social Determinants of Health   Financial Resource Strain:   . Difficulty of Paying Living Expenses:  Food Insecurity:   . Worried About Programme researcher, broadcasting/film/video in the Last Year:   . Barista in the Last Year:   Transportation Needs:   . Freight forwarder (Medical):   Marland Kitchen Lack of Transportation (Non-Medical):   Physical Activity:   . Days of Exercise per Week:   . Minutes of Exercise per Session:   Stress:   . Feeling of Stress :   Social Connections:   . Frequency of Communication with Friends and Family:   . Frequency of Social Gatherings with Friends and Family:   . Attends Religious Services:   . Active Member of Clubs or Organizations:   . Attends Banker Meetings:   Marland Kitchen Marital Status:      Family History: The patient's family history includes Asthma in her brother and mother; Cancer in her  maternal grandmother; Diabetes in her father and paternal grandfather; Lactose intolerance in her mother. There is no history of Irritable bowel syndrome.  ROS:   Please see the history of present illness.     All other systems reviewed and are negative.  EKGs/Labs/Other Studies Reviewed:    The following studies were reviewed today:   EKG:  EKG is  ordered today.  The ekg ordered today demonstrates normal sinus rhythm, rate 73, no ST/T abnormalities  Cardiac monitor 04/30/2019   No significant abnormalities   Predominant rhythm is sinus rhythm. Range is 48 to 147 bpm with average of 76 bpm. No atrial fibrillation, sustained ventricular tachycardia, significant pause, or high degree AV block. Total ectopy <1%. No patient triggered events. No significant abnormalities.  Recent Labs: 03/17/2019: BUN 9; Creatinine, Ser 0.89; Hemoglobin 13.1; Platelets 212; Potassium 4.0; Sodium 140  Recent Lipid Panel    Component Value Date/Time   CHOL 151 04/15/2013 1527   TRIG 69 04/15/2013 1527   HDL 46 04/15/2013 1527   CHOLHDL 3.3 04/15/2013 1527   VLDL 14 04/15/2013 1527   LDLCALC 91 04/15/2013 1527    Physical Exam:    VS:  There were no vitals taken for this visit.    Wt Readings from Last 3 Encounters:  03/19/19 203 lb (92.1 kg)  01/02/19 207 lb (93.9 kg)  01/18/18 201 lb 15.1 oz (91.6 kg)     GEN:  Well nourished, well developed in no acute distress HEENT: Normal NECK: No JVD LYMPHATICS: No lymphadenopathy CARDIAC: RRR, no murmurs, rubs, gallops RESPIRATORY:  Clear to auscultation without rales, wheezing or rhonchi  ABDOMEN: Soft, non-tender, non-distended MUSCULOSKELETAL:  No edema; No deformity  SKIN: Warm and dry NEUROLOGIC:  Alert and oriented x 3 PSYCHIATRIC:  Normal affect   ASSESSMENT:    No diagnosis found. PLAN:    In order of problems listed above:  Syncope: Description suggests vasovagal syncope, as describes prodromal symptoms preceding episode of  syncope.  Cardiac monitor shows no significant arrhythmias.  TTE pending to rule out structural heart disease.  - I did discuss the [place] DMV medical guidelines for driving: "it is prudent to recommend that all persons should be free of syncopal episodes for at least six months to be granted the driving privilege." (THE [place] PHYSICIAN'S GUIDE TO DRIVER MEDICAL EVALUATION, Second Edition, Medical Review Branch, Associate Professor, Division of [employer] of Transportation, July 2004)   Chest pain: Atypical, as describes left-sided pressure that occurred at rest.  No exertional symptoms.  Given age and lack of risk factors, no further work-up recommended at this  time  RTC in***    Medication Adjustments/Labs and Tests Ordered: Current medicines are reviewed at length with the patient today.  Concerns regarding medicines are outlined above.  No orders of the defined types were placed in this encounter.  No orders of the defined types were placed in this encounter.   There are no Patient Instructions on file for this visit.   Signed, Little Ishikawa, MD  06/15/2019 9:48 PM    [facility]

## 2019-06-17 ENCOUNTER — Ambulatory Visit: Payer: BC Managed Care – PPO | Admitting: Cardiology

## 2019-06-21 ENCOUNTER — Ambulatory Visit: Payer: BC Managed Care – PPO | Attending: Internal Medicine

## 2019-06-21 DIAGNOSIS — Z23 Encounter for immunization: Secondary | ICD-10-CM

## 2019-06-21 NOTE — Progress Notes (Signed)
   Covid-19 Vaccination Clinic  Name:  Tina Dickson    MRN: [id number] DOB: 08/10/1995  06/21/2019  Ms. Tina Dickson was observed post Covid-19 immunization for 15 minutes without incident. She was provided with Vaccine Information Sheet and instruction to access the V-Safe system.   Ms. Tina Dickson was instructed to call 911 with any severe reactions post vaccine: Marland Kitchen Difficulty breathing  . Swelling of face and throat  . A fast heartbeat  . A bad rash all over body  . Dizziness and weakness   Immunizations Administered    Name Date Dose VIS Date Route   Pfizer COVID-19 Vaccine 06/21/2019 11:14 AM 0.3 mL 03/14/2019 Intramuscular   Manufacturer: [employer], [employer]   Lot: [id number]   NDC: [address]-7

## 2019-06-24 ENCOUNTER — Encounter: Payer: Self-pay | Admitting: Cardiology

## 2019-06-25 ENCOUNTER — Ambulatory Visit: Payer: BC Managed Care – PPO

## 2020-08-01 DIAGNOSIS — A749 Chlamydial infection, unspecified: Secondary | ICD-10-CM

## 2020-08-01 HISTORY — DX: Chlamydial infection, unspecified: A74.9

## 2020-08-23 ENCOUNTER — Inpatient Hospital Stay (HOSPITAL_COMMUNITY)
Admission: AD | Admit: 2020-08-23 | Discharge: 2020-08-24 | Disposition: A | Discharge status: Home / Self Care | Payer: BC Managed Care – PPO | Attending: Obstetrics & Gynecology | Admitting: Obstetrics & Gynecology

## 2020-08-23 DIAGNOSIS — O26891 Other specified pregnancy related conditions, first trimester: Secondary | ICD-10-CM

## 2020-08-23 DIAGNOSIS — O3680X0 Pregnancy with inconclusive fetal viability, not applicable or unspecified: Secondary | ICD-10-CM | POA: Insufficient documentation

## 2020-08-23 DIAGNOSIS — Z3A01 Less than 8 weeks gestation of pregnancy: Secondary | ICD-10-CM | POA: Diagnosis not present

## 2020-08-23 DIAGNOSIS — R102 Pelvic and perineal pain: Secondary | ICD-10-CM | POA: Diagnosis not present

## 2020-08-23 DIAGNOSIS — O9A211 Injury, poisoning and certain other consequences of external causes complicating pregnancy, first trimester: Secondary | ICD-10-CM | POA: Diagnosis not present

## 2020-08-23 DIAGNOSIS — W108XXA Fall (on) (from) other stairs and steps, initial encounter: Secondary | ICD-10-CM | POA: Diagnosis not present

## 2020-08-23 DIAGNOSIS — W010XXA Fall on same level from slipping, tripping and stumbling without subsequent striking against object, initial encounter: Secondary | ICD-10-CM | POA: Diagnosis not present

## 2020-08-23 DIAGNOSIS — Y9301 Activity, walking, marching and hiking: Secondary | ICD-10-CM | POA: Insufficient documentation

## 2020-08-23 DIAGNOSIS — T1490XA Injury, unspecified, initial encounter: Secondary | ICD-10-CM | POA: Diagnosis not present

## 2020-08-23 LAB — POCT PREGNANCY, URINE: Preg Test, Ur: POSITIVE — AB

## 2020-08-23 NOTE — MAU Note (Addendum)
PT SAYS  SHE DID HPT ON LAST Monday- POSITIVE  SHE FELL TONIGHT IN RAIN- AT 730PM- GOING UP STEPS SLIPPED AND FELL ON HER SIDE - EVER SINCE HAS HAD SHARP PAIN IN LOWER ABD -  [facility]- APPOINTMENT ON 5-31  WITH DR Mindi Slicker

## 2020-08-24 ENCOUNTER — Encounter: Payer: Self-pay | Admitting: Advanced Practice Midwife

## 2020-08-24 ENCOUNTER — Inpatient Hospital Stay (HOSPITAL_COMMUNITY): Payer: BC Managed Care – PPO

## 2020-08-24 DIAGNOSIS — O3680X0 Pregnancy with inconclusive fetal viability, not applicable or unspecified: Secondary | ICD-10-CM

## 2020-08-24 DIAGNOSIS — O9A211 Injury, poisoning and certain other consequences of external causes complicating pregnancy, first trimester: Secondary | ICD-10-CM

## 2020-08-24 DIAGNOSIS — T1490XA Injury, unspecified, initial encounter: Secondary | ICD-10-CM

## 2020-08-24 DIAGNOSIS — Z3A01 Less than 8 weeks gestation of pregnancy: Secondary | ICD-10-CM

## 2020-08-24 DIAGNOSIS — W108XXA Fall (on) (from) other stairs and steps, initial encounter: Secondary | ICD-10-CM

## 2020-08-24 LAB — GC/CHLAMYDIA PROBE AMP (~~LOC~~) NOT AT ARMC
Chlamydia: POSITIVE — AB
Comment: NEGATIVE
Comment: NORMAL
Neisseria Gonorrhea: NEGATIVE

## 2020-08-24 LAB — CBC
HCT: 37.4 % (ref 36.0–46.0)
Hemoglobin: 12.5 g/dL (ref 12.0–15.0)
MCH: 28.1 pg (ref 26.0–34.0)
MCHC: 33.4 g/dL (ref 30.0–36.0)
MCV: 84 fL (ref 80.0–100.0)
Platelets: 224 10*3/uL (ref 150–400)
RBC: 4.45 MIL/uL (ref 3.87–5.11)
RDW: 12.8 % (ref 11.5–15.5)
WBC: 7.6 10*3/uL (ref 4.0–10.5)
nRBC: 0 % (ref 0.0–0.2)

## 2020-08-24 LAB — WET PREP, GENITAL
Sperm: NONE SEEN
Trich, Wet Prep: NONE SEEN
Yeast Wet Prep HPF POC: NONE SEEN

## 2020-08-24 LAB — HCG, QUANTITATIVE, PREGNANCY: hCG, Beta Chain, Quant, S: 2854 m[IU]/mL — ABNORMAL HIGH (ref <5)

## 2020-08-24 NOTE — Discharge Instructions (Signed)
Threatened Miscarriage A threatened miscarriage is when a woman bleeds in her vagina during the first 20 weeks of pregnancy but the pregnancy has not ended. The doctor will do tests to make sure you are still pregnant. This condition does not mean your pregnancy will end, but it does increase the risk that it will end (miscarriage). What are the causes? Normally, the cause of this condition is not known. What increases the risk? These things may make a pregnant woman more likely to lose a pregnancy: Certain health problems  Conditions that affect hormones, such as thyroid disease or polycystic ovary syndrome.  Diabetes.  Disorders that cause the body's disease-fighting system to attack itself by mistake.  Infections.  Bleeding problems.  Being very overweight. Lifestyle factors  Using products that have tobacco or nicotine in them.  Being around tobacco smoke.  Having a lot of caffeine.  Using drugs. Problems with reproductive organs or parts  Having a cervix that opens and thins before you are ready to give birth. The cervix is the lowest part of the womb.  Having Asherman syndrome, which leads to: ? Scars in the womb. ? The womb being an abnormal shape.  Growths (fibroids) in the womb.  Problems in the body that are present at birth.  Infection of the cervix or womb. Personal or health history  Injury.  Having lost an unborn baby before.  Being younger than age 18 or older than age 35.  Being around a harmful substance, such as radiation.  Having lead or other heavy metals in: ? Things you eat or drink. ? The air around you.  Using certain medicines. What are the signs or symptoms?  Bleeding from the vagina. You may also have cramps or pain.  Mild pain or cramps in your belly. How is this diagnosed?  The doctor will do tests, such as an ultrasound to make sure you are still pregnant.   How is this treated? There are no treatments that prevent loss of  pregnancy. But you need to do the right things to take care of yourself at home. Follow these instructions at home:  Get a lot of rest.  Do not have sex or douche if there is bleeding in the vagina.  Do not put things such as tampons in the vagina if it is bleeding.  Do not smoke or use drugs.  Do not drink alcohol.  Avoid caffeine.  Keep all follow-up visits while you are pregnant. Contact a doctor if:  You are pregnant and you have one of these: ? Light bleeding coming from your vagina. ? Spots of blood coming from your vagina.  You have belly pain or cramping.  You have a fever. Get help right away if:  Blood soaks through 2 large pads an hour for more than 2 hours.  Clots of blood come from your vagina.  Tissue comes out of your vagina.  Fluid leaks or gushes from your vagina.  You have very bad pain in your low back.  You have very bad cramps in your belly.  You have a fever, chills, and very bad belly pain. Summary  A threatened miscarriage is when a woman bleeds in her vagina during the first 20 weeks of pregnancy but the pregnancy has not ended.  Normally, the cause of this condition is not known.  Symptoms include bleeding in the vagina or mild pain or cramps in your belly.  There are no treatments that prevent loss of pregnancy.  Keep   all follow-up visits while you are pregnant. This information is not intended to replace advice given to you by your health care provider. Make sure you discuss any questions you have with your health care provider. Document Revised: 09/19/2019 Document Reviewed: 09/19/2019 Elsevier Patient Education  2021 Elsevier Inc.  

## 2020-08-24 NOTE — MAU Provider Note (Signed)
Chief Complaint: Fall   Event Date/Time   First Provider Initiated Contact with Patient 08/24/20 0250        SUBJECTIVE HPI: Tina Dickson is a 25 y.o. G2P1001 at [redacted]w[redacted]d by LMP who presents to maternity admissions reporting sharp pain in her lower abdomen after falling on her side in the rain.  No bleeding. She denies vaginal itching/burning, urinary symptoms, h/a, dizziness, n/v, or fever/chills.     Fall The accident occurred 1 to 3 hours ago. The fall occurred while walking. There was no blood loss. Point of impact: side of abdomen. Pain location: pelvis. Pertinent negatives include no fever, headaches, loss of consciousness, nausea, numbness or tingling. She has tried nothing for the symptoms.   RN Note: PT SAYS  SHE DID HPT ON LAST Monday- POSITIVE  SHE FELL TONIGHT IN RAIN- AT 730PM- GOING UP STEPS SLIPPED AND FELL ON HER SIDE - EVER SINCE HAS HAD SHARP PAIN IN LOWER ABD -  [facility]- APPOINTMENT ON 5-31  WITH DR Mindi Slicker  Past Medical History:  Diagnosis Date  . Abdominal pain, recurrent    characterized as crampy, spasmodic pain  . Asthma    as a child- not since 2010  . Chronic constipation   . Eczema    Past Surgical History:  Procedure Laterality Date  . HERNIA REPAIR  1998   Social History   Socioeconomic History  . Marital status: Married    Spouse name: Not on file  . Number of children: Not on file  . Years of education: Not on file  . Highest education level: Not on file  Occupational History  . Not on file  Tobacco Use  . Smoking status: Never Smoker  . Smokeless tobacco: Never Used  Vaping Use  . Vaping Use: Never used  Substance and Sexual Activity  . Alcohol use: No  . Drug use: No  . Sexual activity: Not Currently    Birth control/protection: Implant    Comment: placed Jan 2019  Other Topics Concern  . Not on file  Social History Narrative   Lives with Mom and brother together with grandmother and her husband.   Social Determinants of Health    Financial Resource Strain: Not on file  Food Insecurity: Not on file  Transportation Needs: Not on file  Physical Activity: Not on file  Stress: Not on file  Social Connections: Not on file  Intimate Partner Violence: Not on file   No current facility-administered medications on file prior to encounter.   Current Outpatient Medications on File Prior to Encounter  Medication Sig Dispense Refill  . amoxicillin (AMOXIL) 500 MG capsule Take 1 capsule (500 mg total) by mouth 2 (two) times daily. 20 capsule 0  . fluconazole (DIFLUCAN) 150 MG tablet Take 1 tablet (150 mg total) by mouth daily. Take second dose 72 hours later if symptoms still persists. 2 tablet 0  . LESSINA-28 0.1-20 MG-MCG tablet Take 1 tablet by mouth daily.    . [DISCONTINUED] cetirizine (ZYRTEC) 10 MG tablet Take 1 tablet (10 mg total) by mouth daily for 10 days. (Patient not taking: Reported on 01/18/2018) 10 tablet 0   No Known Allergies  I have reviewed patient's Past Medical Hx, Surgical Hx, Family Hx, Social Hx, medications and allergies.   ROS:  Review of Systems  Constitutional: Negative for fever.  Gastrointestinal: Negative for nausea.  Neurological: Negative for tingling, loss of consciousness, numbness and headaches.   Review of Systems  Other systems negative  Physical Exam  Physical Exam Patient Vitals for the past 24 hrs:  BP Temp Temp src Pulse Resp Height Weight  08/23/20 2309 123/70 98.4 F (36.9 C) Oral 83 20 5\' 4"  (1.626 m) 98.9 kg   Constitutional: Well-developed, well-nourished female in no acute distress.  Cardiovascular: normal rate Respiratory: normal effort GI: Abd soft, non-tender.  MS: Extremities nontender, no edema, normal ROM Neurologic: Alert and oriented x 4.  GU: Neg CVAT.  PELVIC EXAM: exam deferred due to unit volume and acuity and mildness of her pain.  Also had a reassuring that ruled out ectopic  LAB RESULTS Results for orders placed or performed during the  hospital encounter of 08/23/20 (from the past 24 hour(s))  Pregnancy, urine POC     Status: Abnormal   Collection Time: 08/23/20 11:22 PM  Result Value Ref Range   Preg Test, Ur POSITIVE (A) NEGATIVE  Wet prep, genital     Status: Abnormal   Collection Time: 08/24/20 12:12 AM   Specimen: Vaginal  Result Value Ref Range   Yeast Wet Prep HPF POC NONE SEEN NONE SEEN   Trich, Wet Prep NONE SEEN NONE SEEN   Clue Cells Wet Prep HPF POC PRESENT (A) NONE SEEN   WBC, Wet Prep HPF POC MANY (A) NONE SEEN   Sperm NONE SEEN   CBC     Status: None   Collection Time: 08/24/20 12:27 AM  Result Value Ref Range   WBC 7.6 4.0 - 10.5 K/uL   RBC 4.45 3.87 - 5.11 MIL/uL   Hemoglobin 12.5 12.0 - 15.0 g/dL   HCT 08/26/20 28.7 - 86.7 %   MCV 84.0 80.0 - 100.0 fL   MCH 28.1 26.0 - 34.0 pg   MCHC 33.4 30.0 - 36.0 g/dL   RDW 67.2 09.4 - 70.9 %   Platelets 224 150 - 400 K/uL   nRBC 0.0 0.0 - 0.2 %  hCG, quantitative, pregnancy     Status: Abnormal   Collection Time: 08/24/20 12:27 AM  Result Value Ref Range   hCG, Beta Chain, Quant, S 2,854 (H) <5 mIU/mL       IMAGING 08/26/20 OB LESS THAN 14 WEEKS WITH OB TRANSVAGINAL  Result Date: 08/24/2020 CLINICAL DATA:  Pregnant, fall, lower abdominal pain. LMP 07/14/2020 EXAM: OBSTETRIC <14 WK 07/16/2020 AND TRANSVAGINAL OB US TECHNIQUE: Both transabdominal and transvaginal ultrasound examinations were performed for complete evaluation of the gestation as well as the maternal uterus, adnexal regions, and pelvic cul-de-sac. Transvaginal technique was performed to assess early pregnancy. COMPARISON:  None. FINDINGS: Intrauterine gestational sac: Present, single Yolk sac:  Not visualized Embryo:  Not visualized Cardiac Activity: Not applicable MSD: 5 mm   5 w   1 d Subchorionic hemorrhage:  None visualized. Maternal uterus/adnexae: The uterus is retroverted. No intrauterine masses are seen. The cervix is closed and is unremarkable. The maternal ovaries are unremarkable. A small amount of  simple appearing fluid is seen surrounding the right ovary and within the midline pelvis. IMPRESSION: Intrauterine gestational sac identified. Nonvisualization of the yolk sac roughly estimates the gestational between 5.0 and 5.5 weeks. Follow-up sonography in 10-14 days is recommended to document appropriate progression and more accurately age the gestation. Electronically Signed   By: 12-27-1978 MD   On: 08/24/2020 02:53     MAU Management/MDM: Ordered usual first trimester r/o ectopic labs.   Pelvic cultures done Will check baseline Ultrasound to rule out ectopic.  This bleeding/pain can represent a normal pregnancy with  bleeding, spontaneous abortion or even an ectopic which can be life-threatening.  The process as listed above helps to determine which of these is present.  Reviewed findings with patient Discussed we cannot totally rule out ectopic pregnancy until we see at least a yolk sac in the GS. Recommend repeat HCG quant on Thursday am and [place] in a week Dr Senaida Ores updated and she will update office  ASSESSMENT 1. Pelvic pain affecting pregnancy in first trimester, antepartum   2.     Pregnancy of unknown location  PLAN Discharge home Plan to repeat HCG level in 48 hours in office Thursday am Will repeat  Ultrasound in about 7-10 days if HCG levels double appropriately  Ectopic precautions  Pt stable at time of discharge. Encouraged to return here if she develops worsening of symptoms, increase in pain, fever, or other concerning symptoms.   Has an upcoming appt with Dr Mindi Slicker   Wynelle Bourgeois CNM, MSN Certified Nurse-Midwife 08/24/2020  2:50 AM

## 2020-08-25 ENCOUNTER — Other Ambulatory Visit: Payer: Self-pay | Admitting: Obstetrics and Gynecology

## 2020-08-25 ENCOUNTER — Other Ambulatory Visit: Payer: Self-pay | Admitting: Advanced Practice Midwife

## 2020-08-25 MED ORDER — AZITHROMYCIN 500 MG PO TABS: 1000.0000 mg | ORAL_TABLET | Freq: Once | ORAL | 1 refills | Status: AC

## 2020-08-25 NOTE — Progress Notes (Signed)
Chlamydia positive Message sent via MD to office to call Rx Zithromax

## 2020-09-20 LAB — OB RESULTS CONSOLE ANTIBODY SCREEN: Antibody Screen: NEGATIVE

## 2020-09-20 LAB — OB RESULTS CONSOLE RPR: RPR: NONREACTIVE

## 2020-09-20 LAB — OB RESULTS CONSOLE ABO/RH: RH Type: POSITIVE

## 2020-09-20 LAB — OB RESULTS CONSOLE RUBELLA ANTIBODY, IGM: Rubella: IMMUNE

## 2020-09-20 LAB — OB RESULTS CONSOLE GC/CHLAMYDIA: Gonorrhea: NEGATIVE

## 2020-09-20 LAB — OB RESULTS CONSOLE VARICELLA ZOSTER ANTIBODY, IGG: Varicella: IMMUNE

## 2020-09-20 LAB — OB RESULTS CONSOLE HIV ANTIBODY (ROUTINE TESTING): HIV: NONREACTIVE

## 2020-09-20 LAB — HEPATITIS C ANTIBODY: HCV Ab: NEGATIVE

## 2020-09-20 LAB — OB RESULTS CONSOLE HEPATITIS B SURFACE ANTIGEN: Hepatitis B Surface Ag: NEGATIVE

## 2020-10-04 ENCOUNTER — Other Ambulatory Visit: Payer: Self-pay

## 2020-10-04 ENCOUNTER — Inpatient Hospital Stay (HOSPITAL_COMMUNITY)
Admission: AD | Admit: 2020-10-04 | Discharge: 2020-10-04 | Disposition: A | Discharge status: Home / Self Care | Payer: BC Managed Care – PPO | Attending: Obstetrics and Gynecology | Admitting: Obstetrics and Gynecology

## 2020-10-04 ENCOUNTER — Inpatient Hospital Stay (HOSPITAL_COMMUNITY): Payer: BC Managed Care – PPO

## 2020-10-04 ENCOUNTER — Encounter (HOSPITAL_COMMUNITY): Payer: Self-pay | Admitting: Obstetrics and Gynecology

## 2020-10-04 DIAGNOSIS — G44319 Acute post-traumatic headache, not intractable: Secondary | ICD-10-CM | POA: Insufficient documentation

## 2020-10-04 DIAGNOSIS — T1490XA Injury, unspecified, initial encounter: Secondary | ICD-10-CM | POA: Diagnosis not present

## 2020-10-04 DIAGNOSIS — R109 Unspecified abdominal pain: Secondary | ICD-10-CM | POA: Diagnosis not present

## 2020-10-04 DIAGNOSIS — O26891 Other specified pregnancy related conditions, first trimester: Secondary | ICD-10-CM | POA: Diagnosis not present

## 2020-10-04 DIAGNOSIS — Z3A11 11 weeks gestation of pregnancy: Secondary | ICD-10-CM | POA: Diagnosis not present

## 2020-10-04 DIAGNOSIS — W19XXXA Unspecified fall, initial encounter: Secondary | ICD-10-CM | POA: Insufficient documentation

## 2020-10-04 DIAGNOSIS — Y929 Unspecified place or not applicable: Secondary | ICD-10-CM | POA: Insufficient documentation

## 2020-10-04 DIAGNOSIS — R112 Nausea with vomiting, unspecified: Secondary | ICD-10-CM | POA: Insufficient documentation

## 2020-10-04 DIAGNOSIS — O99351 Diseases of the nervous system complicating pregnancy, first trimester: Secondary | ICD-10-CM | POA: Diagnosis not present

## 2020-10-04 DIAGNOSIS — Y9389 Activity, other specified: Secondary | ICD-10-CM | POA: Diagnosis not present

## 2020-10-04 LAB — URINALYSIS, ROUTINE W REFLEX MICROSCOPIC
Bacteria, UA: NONE SEEN
Bilirubin Urine: NEGATIVE
Glucose, UA: NEGATIVE mg/dL
Hgb urine dipstick: NEGATIVE
Ketones, ur: NEGATIVE mg/dL
Nitrite: NEGATIVE
Protein, ur: NEGATIVE mg/dL
Specific Gravity, Urine: 1.026 (ref 1.005–1.030)
pH: 5 (ref 5.0–8.0)

## 2020-10-04 LAB — BASIC METABOLIC PANEL WITH GFR
Anion gap: 6 (ref 5–15)
BUN: 6 mg/dL (ref 6–20)
CO2: 21 mmol/L — ABNORMAL LOW (ref 22–32)
Calcium: 8.9 mg/dL (ref 8.9–10.3)
Chloride: 108 mmol/L (ref 98–111)
Creatinine, Ser: 0.62 mg/dL (ref 0.44–1.00)
GFR, Estimated: >60 mL/min
Glucose, Bld: 96 mg/dL (ref 70–99)
Potassium: 3.5 mmol/L (ref 3.5–5.1)
Sodium: 135 mmol/L (ref 135–145)

## 2020-10-04 LAB — CBC
HCT: 37.2 % (ref 36.0–46.0)
Hemoglobin: 12.5 g/dL (ref 12.0–15.0)
MCH: 28.2 pg (ref 26.0–34.0)
MCHC: 33.6 g/dL (ref 30.0–36.0)
MCV: 83.8 fL (ref 80.0–100.0)
Platelets: 207 K/uL (ref 150–400)
RBC: 4.44 MIL/uL (ref 3.87–5.11)
RDW: 12.8 % (ref 11.5–15.5)
WBC: 6.1 K/uL (ref 4.0–10.5)
nRBC: 0 % (ref 0.0–0.2)

## 2020-10-04 MED ORDER — DIPHENHYDRAMINE HCL 50 MG/ML IJ SOLN
25.0000 mg | Freq: Once | INTRAMUSCULAR | Status: AC
  Administered 2020-10-04: 25 mg via INTRAVENOUS
  Filled 2020-10-04: qty 1

## 2020-10-04 MED ORDER — ONDANSETRON HCL 4 MG/2ML IJ SOLN
4.0000 mg | Freq: Once | INTRAMUSCULAR | Status: AC
  Administered 2020-10-04: 4 mg via INTRAVENOUS
  Filled 2020-10-04: qty 2

## 2020-10-04 MED ORDER — LACTATED RINGERS IV BOLUS
1000.0000 mL | Freq: Once | INTRAVENOUS | Status: AC
  Administered 2020-10-04: 1000 mL via INTRAVENOUS

## 2020-10-04 MED ORDER — METOCLOPRAMIDE HCL 5 MG/ML IJ SOLN
10.0000 mg | Freq: Once | INTRAMUSCULAR | Status: AC
  Administered 2020-10-04: 10 mg via INTRAVENOUS
  Filled 2020-10-04: qty 2

## 2020-10-04 MED ORDER — DEXAMETHASONE SODIUM PHOSPHATE 10 MG/ML IJ SOLN
10.0000 mg | Freq: Once | INTRAMUSCULAR | Status: AC
  Administered 2020-10-04: 10 mg via INTRAVENOUS
  Filled 2020-10-04: qty 1
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
## 2021-01-04 LAB — OB RESULTS CONSOLE GC/CHLAMYDIA
Chlamydia: NEGATIVE
Gonorrhea: NEGATIVE

## 2021-01-29 ENCOUNTER — Other Ambulatory Visit: Payer: Self-pay

## 2021-01-29 ENCOUNTER — Inpatient Hospital Stay (HOSPITAL_COMMUNITY)
Admission: AD | Admit: 2021-01-29 | Discharge: 2021-01-29 | Disposition: A | Discharge status: Home / Self Care | Payer: BC Managed Care – PPO | Attending: Obstetrics and Gynecology | Admitting: Obstetrics and Gynecology

## 2021-01-29 DIAGNOSIS — O99513 Diseases of the respiratory system complicating pregnancy, third trimester: Secondary | ICD-10-CM | POA: Diagnosis not present

## 2021-01-29 DIAGNOSIS — Z3A28 28 weeks gestation of pregnancy: Secondary | ICD-10-CM

## 2021-01-29 DIAGNOSIS — R040 Epistaxis: Secondary | ICD-10-CM

## 2021-01-29 DIAGNOSIS — O99891 Other specified diseases and conditions complicating pregnancy: Secondary | ICD-10-CM

## 2021-01-29 NOTE — MAU Provider Note (Signed)
  Chief Complaint: Epistaxis   Event Date/Time   First Provider Initiated Contact with Patient 01/29/21 1243      SUBJECTIVE HPI: Tina Dickson is a 25 y.o. G2P1001 who presents to maternity admissions for of recent nose bleed. She reports becoming concerned when she spit and saw blood in her sputum. She does not have an active nose bleed at this time. She has no OB complaints.   Past Medical History:  Diagnosis Date   Abdominal pain, recurrent    characterized as crampy, spasmodic pain   Asthma    as a child- not since 2010   Chronic constipation    Eczema    Past Surgical History:  Procedure Laterality Date   HERNIA REPAIR  1998   Social History   Socioeconomic History   Marital status: Single    Spouse name: Not on file   Number of children: Not on file   Years of education: Not on file   Highest education level: Not on file  Occupational History   Not on file  Tobacco Use   Smoking status: Never   Smokeless tobacco: Never  Vaping Use   Vaping Use: Never used  Substance and Sexual Activity   Alcohol use: No   Drug use: No   Sexual activity: Not Currently    Birth control/protection: Implant    Comment: placed Jan 2019  Other Topics Concern   Not on file  Social History Narrative   Lives with Mom and brother together with grandmother and her husband.   Social Determinants of Health   Financial Resource Strain: Not on file  Food Insecurity: Not on file  Transportation Needs: Not on file  Physical Activity: Not on file  Stress: Not on file  Social Connections: Not on file  Intimate Partner Violence: Not on file   No current facility-administered medications on file prior to encounter.   Current Outpatient Medications on File Prior to Encounter  Medication Sig Dispense Refill   [DISCONTINUED] cetirizine (ZYRTEC) 10 MG tablet Take 1 tablet (10 mg total) by mouth daily for 10 days. (Patient not taking: Reported on 01/18/2018) 10 tablet 0   No Known  Allergies  ROS:  Review of Systems  HENT:  Positive for nosebleeds. Negative for congestion and sore throat.   Gastrointestinal:  Negative for abdominal pain.  Genitourinary:  Negative for vaginal bleeding.   I have reviewed patient's Past Medical Hx, Surgical Hx, Family Hx, Social Hx, medications and allergies.   Physical Exam  Patient Vitals for the past 24 hrs:  BP Temp Temp src Pulse Resp SpO2  01/29/21 1240 124/79 98.4 F (36.9 C) Oral (!) 110 16 100 %   Physical Exam Constitutional:      General: She is not in acute distress.    Appearance: Normal appearance. She is not ill-appearing, toxic-appearing or diaphoretic.  HENT:     Nose: Nose normal.  Skin:    General: Skin is warm.  Neurological:     Mental Status: She is alert and oriented to person, place, and time.    MDM Patient denies any concerning symptoms in need of emergent evaluation. She has no active nose bleeding at this time, and no OB complaints.   ASSESSMENT MSE Complete Bleeding nose  [redacted] weeks gestation of pregnancy - Plan: Discharge patient   PLAN  Discharge home Recommend OTC nasal spray Return to [facility] for [facility] emergencies   Yanice Maqueda, Harolyn Rutherford, NP 01/30/2021 9:54 PM

## 2021-01-29 NOTE — MAU Note (Signed)
Pt reports to mau with c/o 2 episodes of her nose bleeding this morning as well as seeing blood in her sputum after coughing.  Denies CTX or LOF.

## 2021-03-28 ENCOUNTER — Inpatient Hospital Stay (EMERGENCY_DEPARTMENT_HOSPITAL)
Admission: AD | Admit: 2021-03-28 | Discharge: 2021-03-28 | Disposition: A | Discharge status: Home / Self Care | Payer: BC Managed Care – PPO | Source: Home / Self Care | Attending: Obstetrics and Gynecology | Admitting: Obstetrics and Gynecology

## 2021-03-28 ENCOUNTER — Encounter (HOSPITAL_COMMUNITY): Payer: Self-pay | Admitting: Obstetrics and Gynecology

## 2021-03-28 ENCOUNTER — Other Ambulatory Visit: Payer: Self-pay

## 2021-03-28 ENCOUNTER — Inpatient Hospital Stay (HOSPITAL_COMMUNITY)
Admission: AD | Admit: 2021-03-28 | Discharge: 2021-03-28 | Disposition: A | Discharge status: Home / Self Care | Payer: BC Managed Care – PPO | Attending: Obstetrics and Gynecology | Admitting: Obstetrics and Gynecology

## 2021-03-28 DIAGNOSIS — M549 Dorsalgia, unspecified: Secondary | ICD-10-CM | POA: Diagnosis not present

## 2021-03-28 DIAGNOSIS — Z3A36 36 weeks gestation of pregnancy: Secondary | ICD-10-CM | POA: Insufficient documentation

## 2021-03-28 DIAGNOSIS — R519 Headache, unspecified: Secondary | ICD-10-CM | POA: Insufficient documentation

## 2021-03-28 DIAGNOSIS — R040 Epistaxis: Secondary | ICD-10-CM | POA: Insufficient documentation

## 2021-03-28 DIAGNOSIS — O26893 Other specified pregnancy related conditions, third trimester: Secondary | ICD-10-CM | POA: Insufficient documentation

## 2021-03-28 DIAGNOSIS — O212 Late vomiting of pregnancy: Secondary | ICD-10-CM | POA: Insufficient documentation

## 2021-03-28 DIAGNOSIS — K529 Noninfective gastroenteritis and colitis, unspecified: Secondary | ICD-10-CM | POA: Insufficient documentation

## 2021-03-28 DIAGNOSIS — O218 Other vomiting complicating pregnancy: Secondary | ICD-10-CM | POA: Insufficient documentation

## 2021-03-28 LAB — CBC
HCT: 35.1 % — ABNORMAL LOW (ref 36.0–46.0)
Hemoglobin: 11.2 g/dL — ABNORMAL LOW (ref 12.0–15.0)
MCH: 24.7 pg — ABNORMAL LOW (ref 26.0–34.0)
MCHC: 31.9 g/dL (ref 30.0–36.0)
MCV: 77.3 fL — ABNORMAL LOW (ref 80.0–100.0)
Platelets: 177 10*3/uL (ref 150–400)
RBC: 4.54 MIL/uL (ref 3.87–5.11)
RDW: 13.3 % (ref 11.5–15.5)
WBC: 7.6 10*3/uL (ref 4.0–10.5)
nRBC: 0 % (ref 0.0–0.2)

## 2021-03-28 LAB — COMPREHENSIVE METABOLIC PANEL
ALT: 14 U/L (ref 0–44)
AST: 19 U/L (ref 15–41)
Albumin: 3.3 g/dL — ABNORMAL LOW (ref 3.5–5.0)
Alkaline Phosphatase: 118 U/L (ref 38–126)
Anion gap: 11 (ref 5–15)
BUN: 5 mg/dL — ABNORMAL LOW (ref 6–20)
CO2: 19 mmol/L — ABNORMAL LOW (ref 22–32)
Calcium: 9.1 mg/dL (ref 8.9–10.3)
Chloride: 106 mmol/L (ref 98–111)
Creatinine, Ser: 0.64 mg/dL (ref 0.44–1.00)
GFR, Estimated: >60 mL/min (ref >60)
Glucose, Bld: 80 mg/dL (ref 70–99)
Potassium: 3.8 mmol/L (ref 3.5–5.1)
Sodium: 136 mmol/L (ref 135–145)
Total Bilirubin: 0.6 mg/dL (ref 0.3–1.2)
Total Protein: 7.3 g/dL (ref 6.5–8.1)

## 2021-03-28 LAB — URINALYSIS, ROUTINE W REFLEX MICROSCOPIC
Bilirubin Urine: NEGATIVE
Glucose, UA: NEGATIVE mg/dL
Hgb urine dipstick: NEGATIVE
Ketones, ur: 15 mg/dL — AB
Leukocytes,Ua: NEGATIVE
Nitrite: NEGATIVE
Protein, ur: NEGATIVE mg/dL
Specific Gravity, Urine: 1.015 (ref 1.005–1.030)
pH: 7 (ref 5.0–8.0)

## 2021-03-28 LAB — PROTEIN / CREATININE RATIO, URINE
Creatinine, Urine: 188.35 mg/dL
Protein Creatinine Ratio: 0.07 mg/mg{Cre} (ref 0.00–0.15)
Total Protein, Urine: 14 mg/dL

## 2021-03-28 MED ORDER — LACTATED RINGERS IV BOLUS
1000.0000 mL | Freq: Once | INTRAVENOUS | Status: AC
  Administered 2021-03-28: 12:00:00 1000 mL via INTRAVENOUS

## 2021-03-28 MED ORDER — BUTALBITAL-APAP-CAFFEINE 50-325-40 MG PO TABS: 1.0000 | ORAL_TABLET | Freq: Four times a day (QID) | ORAL | 0 refills | Status: AC | PRN

## 2021-03-28 MED ORDER — METOCLOPRAMIDE HCL 5 MG/ML IJ SOLN
10.0000 mg | Freq: Once | INTRAMUSCULAR | Status: AC
  Administered 2021-03-28: 11:00:00 10 mg via INTRAVENOUS
  Filled 2021-03-28: qty 2

## 2021-03-28 MED ORDER — ONDANSETRON HCL 4 MG/2ML IJ SOLN
4.0000 mg | Freq: Once | INTRAMUSCULAR | Status: AC
  Administered 2021-03-28: 11:00:00 4 mg via INTRAVENOUS
  Filled 2021-03-28: qty 2

## 2021-03-28 MED ORDER — DIPHENHYDRAMINE HCL 50 MG/ML IJ SOLN
25.0000 mg | Freq: Once | INTRAMUSCULAR | Status: AC
  Administered 2021-03-28: 11:00:00 25 mg via INTRAVENOUS
  Filled 2021-03-28: qty 1

## 2021-03-28 MED ORDER — BUTALBITAL-APAP-CAFFEINE 50-325-40 MG PO TABS
2.0000 | ORAL_TABLET | Freq: Once | ORAL | Status: AC
  Administered 2021-03-28: 16:00:00 2 via ORAL
  Filled 2021-03-28: qty 2

## 2021-03-28 MED ORDER — ONDANSETRON 4 MG PO TBDP: 4.0000 mg | ORAL_TABLET | Freq: Four times a day (QID) | ORAL | 0 refills | Status: DC | PRN

## 2021-03-28 MED ORDER — LACTATED RINGERS IV BOLUS
1000.0000 mL | Freq: Once | INTRAVENOUS | Status: AC
  Administered 2021-03-28: 11:00:00 1000 mL via INTRAVENOUS

## 2021-03-28 NOTE — MAU Note (Addendum)
Present with c/o nose bleed, has recently stopped.  Reports took BP @ home, systolov 131, unsure of diastolic, states "80 something". Endorses +FM.

## 2021-03-28 NOTE — MAU Note (Addendum)
.  Tina Dickson is a 25 y.o. at [redacted]w[redacted]d here in MAU reporting: started having back pain at 0200 this morning. States she also reports n/v/d around 0400. Has thrown up x3 and has had diarrhea x2. Endorses a headache, denies visual disturbances. Having some lower abdominal cramping as well. Endorses good fetal movement. Denies VB or LOF. Took 1,000 mg of tylenol at 0500.  Vitals:   03/28/21 1008  BP: (!) 137/95  Pulse: (!) 133  Resp: 15  Temp: 98.4 F (36.9 C)     FHT:155 Lab orders placed from triage:  UA

## 2021-03-28 NOTE — Discharge Instructions (Signed)

## 2021-03-28 NOTE — MAU Provider Note (Signed)
Chief Complaint:  Nose Bleed   Event Date/Time   First Provider Initiated Contact with Patient 03/28/21 1605      HPI: Tina Dickson is a 25 y.o. G2P1001 at [redacted]w[redacted]d who presents to maternity admissions reporting that after she left MAU this morning, she developed a nosebleed and her headache returned.  She reports a heavy nosebleed at home for ~ 1 hour that did resolve before she arrived in MAU.  This morning, she took Tylenol at home for her headache which did not work but then came to MAU where she received Reglan and Benadryl via IV at around 11:00 am.  Her h/a resolved with this IV therapy but returned at home when the nosebleed started.  She reports good fetal movement and denies any cramping or abdominal pain.  Her nausea and vomiting are also improved and she tolerated PO food and fluids at home before arriving.    Location: headache Quality: dull Severity: 7/10 on pain scale Duration: 2 hours Timing: constant Modifying factors: improved with IV Reglan/Benadryl earlier today but returned Associated signs and symptoms: nosebleed  HPI  Past Medical History: Past Medical History:  Diagnosis Date   Abdominal pain, recurrent    characterized as crampy, spasmodic pain   Asthma    as a child- not since 2010   Chronic constipation    Eczema     Past obstetric history: OB History  Gravida Para Term Preterm AB Living  2 1 1  0 0 1  SAB IAB Ectopic Multiple Live Births  0 0 0 0      # Outcome Date GA Lbr Len/2nd Weight Sex Delivery Anes PTL Lv  2 Current           1 Term 12/28/16 [redacted]w[redacted]d 26:40 / 01:04 3280 g F Vag-Spont EPI, Local  LIV    Past Surgical History: Past Surgical History:  Procedure Laterality Date   HERNIA REPAIR  1998    Family History: Family History  Problem Relation Age of Onset   Lactose intolerance Mother    Asthma Mother    Irritable bowel syndrome Father    Diabetes Father    Asthma Brother    Cancer Maternal Grandmother    Diabetes Paternal  Grandfather     Social History: Social History   Tobacco Use   Smoking status: Never   Smokeless tobacco: Never  Vaping Use   Vaping Use: Never used  Substance Use Topics   Alcohol use: No   Drug use: No    Allergies: No Known Allergies  Meds:  Medications Prior to Admission  Medication Sig Dispense Refill Last Dose   acetaminophen (TYLENOL) 500 MG tablet Take 1,000 mg by mouth every 6 (six) hours as needed.      ferrous sulfate 325 (65 FE) MG tablet Take 325 mg by mouth daily with breakfast.      ondansetron (ZOFRAN-ODT) 4 MG disintegrating tablet Take 1 tablet (4 mg total) by mouth every 6 (six) hours as needed for nausea. 20 tablet 0    Prenatal Vit-Fe Fumarate-FA (PRENATAL MULTIVITAMIN) TABS tablet Take 1 tablet by mouth daily at 12 noon.       ROS:  Review of Systems  Constitutional:  Negative for chills, fatigue and fever.  HENT:  Positive for nosebleeds.   Eyes:  Negative for visual disturbance.  Respiratory:  Negative for shortness of breath.   Cardiovascular:  Negative for chest pain.  Gastrointestinal:  Negative for abdominal pain, nausea and vomiting.  Genitourinary:  Negative for difficulty urinating, dysuria, flank pain, pelvic pain, vaginal bleeding, vaginal discharge and vaginal pain.  Neurological:  Positive for headaches. Negative for dizziness.  Psychiatric/Behavioral: Negative.      I have reviewed patient's Past Medical Hx, Surgical Hx, Family Hx, Social Hx, medications and allergies.   Physical Exam  Patient Vitals for the past 24 hrs:  BP Temp Temp src Pulse Resp SpO2 Height Weight  03/28/21 1739 (!) 102/57 97.9 F (36.6 C) Oral 89 18 100 % -- --  03/28/21 1557 121/67 -- Oral (!) 117 18 98 % -- --  03/28/21 1547 134/70 98.1 F (36.7 C) Oral 79 18 98 % -- --  03/28/21 1541 -- -- -- -- -- -- 5\' 3"  (1.6 m) 99.3 kg   Constitutional: Well-developed, well-nourished female in no acute distress.  Cardiovascular: normal rate Respiratory: normal  effort GI: Abd soft, non-tender, gravid appropriate for gestational age.  MS: Extremities nontender, no edema, normal ROM Neurologic: Alert and oriented x 4.  GU: Neg CVAT.  PELVIC EXAM: Cervix pink, visually closed, without lesion, scant white creamy discharge, vaginal walls and external genitalia normal Bimanual exam: Cervix 0/long/high, firm, anterior, neg CMT, uterus nontender, nonenlarged, adnexa without tenderness, enlargement, or mass     FHT:  Baseline 150 , moderate variability, accelerations present, no decelerations Contractions: irritability, mild to palpation   Labs:     Imaging:  No results found.  MAU Course/MDM: Orders Placed This Encounter  Procedures   Discharge patient    Meds ordered this encounter  Medications   butalbital-acetaminophen-caffeine (FIORICET) 50-325-40 MG per tablet 2 tablet   butalbital-acetaminophen-caffeine (FIORICET) 50-325-40 MG tablet    Sig: Take 1-2 tablets by mouth every 6 (six) hours as needed for headache.    Dispense:  20 tablet    Refill:  0    Order Specific Question:   Supervising Provider    Answer:   Truett Mainland [[id number]]     NST reviewed and appropriate for gestational age Reassurance provided about FHR tracing, normal BP, and discussed nosebleeds in pregnancy and treatments/solutions Given that pt tried Tylenol for h/a at home prior to first MAU visit today without success, offered Fioricet for h/a. Pt desires to try Fioricet. Fioricet x 2 tabs resolved h/a completely D/C home Increase PO fluids Rx for Fioricet PRN for h/a F/u as scheduled, return to MAU as needed for emergencies    Assessment: 1. Pregnancy headache in third trimester   2. [redacted] weeks gestation of pregnancy   3. Nosebleed     Plan: Discharge home Labor precautions and fetal kick counts  Follow-up Information     Shivaji, Melida Quitter, MD Follow up.   Specialty: Obstetrics and Gynecology Why: As scheduled Contact information: [place] [id number] [place] [facility] Follow up.   Specialty: Obstetrics and Gynecology Why: As needed for emergencies Contact information: [id number] [place] [phone]               Allergies as of 03/28/2021   No Known Allergies      Medication List     TAKE these medications    acetaminophen 500 MG tablet Commonly known as: TYLENOL Take 1,000 mg by mouth every 6 (six) hours as needed.   butalbital-acetaminophen-caffeine 50-325-40 MG tablet Commonly known as: FIORICET Take 1-2 tablets by mouth every 6 (six) hours as needed for  headache.   ferrous sulfate 325 (65 FE) MG tablet Take 325 mg by mouth daily with breakfast.   ondansetron 4 MG disintegrating tablet Commonly known as: ZOFRAN-ODT Take 1 tablet (4 mg total) by mouth every 6 (six) hours as needed for nausea.   prenatal multivitamin Tabs tablet Take 1 tablet by mouth daily at 12 noon.        Fatima Blank Certified Nurse-Midwife 03/28/2021 5:49 PM

## 2021-03-28 NOTE — MAU Provider Note (Signed)
History     CSN: [id number]  Arrival date and time: 03/28/21 3875   Event Date/Time   First Provider Initiated Contact with Patient 03/28/21 1101      Chief Complaint  Patient presents with   Back Pain   Nausea   Contractions   Emesis   Headache   HPI This is a 25 year old G 2P1001 at 36 weeks and 5 days with a pregnancy complicated by prior pregnancy with preeclampsia.  She presents with nausea, vomiting that started this morning with increasing back pain.  She does have a headache that is pretty severe and not improved with Tylenol 1000mg  she took earlier today.  Her nausea vomiting is complicated by intolerance of oral food and liquid.  No other palliating or provoking factors. Headache initially 7-8/10  OB History     Gravida  2   Para  1   Term  1   Preterm  0   AB  0   Living  1      SAB  0   IAB  0   Ectopic  0   Multiple  0   Live Births              Past Medical History:  Diagnosis Date   Abdominal pain, recurrent    characterized as crampy, spasmodic pain   Asthma    as a child- not since 2010   Chronic constipation    Eczema     Past Surgical History:  Procedure Laterality Date   HERNIA REPAIR  1998    Family History  Problem Relation Age of Onset   Asthma Brother    Lactose intolerance Mother    Asthma Mother    Diabetes Father    Cancer Maternal Grandmother    Diabetes Paternal Grandfather    Irritable bowel syndrome Neg Hx     Social History   Tobacco Use   Smoking status: Never   Smokeless tobacco: Never  Vaping Use   Vaping Use: Never used  Substance Use Topics   Alcohol use: No   Drug use: No    Allergies: No Known Allergies  Medications Prior to Admission  Medication Sig Dispense Refill Last Dose   acetaminophen (TYLENOL) 500 MG tablet Take 1,000 mg by mouth every 6 (six) hours as needed.   03/28/2021 at 0500   ferrous sulfate 325 (65 FE) MG tablet Take 325 mg by mouth daily with breakfast.   Past Week    Prenatal Vit-Fe Fumarate-FA (PRENATAL MULTIVITAMIN) TABS tablet Take 1 tablet by mouth daily at 12 noon.   Past Week    Review of Systems Physical Exam   Blood pressure (!) 91/54, pulse 93, temperature 98.4 F (36.9 C), temperature source Oral, resp. rate 15, last menstrual period 07/14/2020, SpO2 99 %, unknown if currently breastfeeding.  Patient Vitals for the past 24 hrs:  BP Temp Temp src Pulse Resp SpO2  03/28/21 1147 (!) 91/54 -- -- 93 -- --  03/28/21 1146 (!) 76/55 -- -- 97 -- --  03/28/21 1130 (!) 112/57 -- -- 94 -- 99 %  03/28/21 1115 (!) 113/57 -- -- 93 -- 100 %  03/28/21 1100 (!) 114/52 -- -- 91 -- 99 %  03/28/21 1045 128/66 -- -- (!) 105 -- 99 %  03/28/21 1039 113/74 -- -- (!) 125 -- 99 %  03/28/21 1015 125/62 -- -- 98 -- 99 %  03/28/21 1008 (!) 137/95 98.4 F (36.9 C) Oral (!) 133 15 --  Physical Exam Vitals and nursing note reviewed.  Constitutional:      Appearance: She is well-developed.  HENT:     Head: Normocephalic and atraumatic.  Cardiovascular:     Heart sounds: Normal heart sounds.  Pulmonary:     Effort: Pulmonary effort is normal.     Breath sounds: Normal breath sounds.  Abdominal:     General: Bowel sounds are normal.     Palpations: Abdomen is soft.  Skin:    General: Skin is warm and dry.  Neurological:     Mental Status: She is alert.  Psychiatric:        Mood and Affect: Mood normal.        Speech: Speech normal.        Behavior: Behavior normal.   Results for orders placed or performed during the hospital encounter of 03/28/21 (from the past 24 hour(s))  Urinalysis, Routine w reflex microscopic Urine, Clean Catch     Status: Abnormal   Collection Time: 03/28/21 10:12 AM  Result Value Ref Range   Color, Urine YELLOW YELLOW   APPearance CLEAR CLEAR   Specific Gravity, Urine 1.015 1.005 - 1.030   pH 7.0 5.0 - 8.0   Glucose, UA NEGATIVE NEGATIVE mg/dL   Hgb urine dipstick NEGATIVE NEGATIVE   Bilirubin Urine NEGATIVE NEGATIVE    Ketones, ur 15 (A) NEGATIVE mg/dL   Protein, ur NEGATIVE NEGATIVE mg/dL   Nitrite NEGATIVE NEGATIVE   Leukocytes,Ua NEGATIVE NEGATIVE  Protein / creatinine ratio, urine     Status: None   Collection Time: 03/28/21 10:12 AM  Result Value Ref Range   Creatinine, Urine 188.35 mg/dL   Total Protein, Urine 14 mg/dL   Protein Creatinine Ratio 0.07 0.00 - 0.15 mg/mg[Cre]  CBC     Status: Abnormal   Collection Time: 03/28/21 10:40 AM  Result Value Ref Range   WBC 7.6 4.0 - 10.5 K/uL   RBC 4.54 3.87 - 5.11 MIL/uL   Hemoglobin 11.2 (L) 12.0 - 15.0 g/dL   HCT 86.1 (L) 68.3 - 72.9 %   MCV 77.3 (L) 80.0 - 100.0 fL   MCH 24.7 (L) 26.0 - 34.0 pg   MCHC 31.9 30.0 - 36.0 g/dL   RDW 02.1 11.5 - 52.0 %   Platelets 177 150 - 400 K/uL   nRBC 0.0 0.0 - 0.2 %  Comprehensive metabolic panel     Status: Abnormal   Collection Time: 03/28/21 10:40 AM  Result Value Ref Range   Sodium 136 135 - 145 mmol/L   Potassium 3.8 3.5 - 5.1 mmol/L   Chloride 106 98 - 111 mmol/L   CO2 19 (L) 22 - 32 mmol/L   Glucose, Bld 80 70 - 99 mg/dL   BUN 5 (L) 6 - 20 mg/dL   Creatinine, Ser 8.02 0.44 - 1.00 mg/dL   Calcium 9.1 8.9 - 23.3 mg/dL   Total Protein 7.3 6.5 - 8.1 g/dL   Albumin 3.3 (L) 3.5 - 5.0 g/dL   AST 19 15 - 41 U/L   ALT 14 0 - 44 U/L   Alkaline Phosphatase 118 38 - 126 U/L   Total Bilirubin 0.6 0.3 - 1.2 mg/dL   GFR, Estimated >61 >22 mL/min   Anion gap 11 5 - 15    MAU Course  Procedures NST:  Baseline: 130  Variability: moderate Accelerations: ++  Decelerations: one variable early on in tracing, followed by over an hour of reactive tracing Contractions: none  MDM Check CMP, CBC,  Urine protein/creatinine ratio. IVF bolus x 2L. Zofran for nausea/vomiting. Reglan and benadryl for HA.  Headache improved to 3.  Assessment and Plan   1. [redacted] weeks gestation of pregnancy   2. Gastroenteritis    Patient passed p.o. challenge.  Headache improved.  This does not appear to be preeclampsia and  this has been ruled out.  Other than her first blood pressure, the remainder blood pressures have been  normal.  Labs appear normal as well.  Discharge patient with oral Zofran.  Recommended MiraLAX to prevent constipation.  Follow-up with OB provider later this week.  Tina Dickson 03/28/2021, 12:13 PM

## 2021-03-30 LAB — OB RESULTS CONSOLE GBS: GBS: NEGATIVE

## 2021-04-03 HISTORY — PX: DILATION AND CURETTAGE OF UTERUS: SHX78

## 2021-04-03 NOTE — L&D Delivery Note (Addendum)
DELIVERY NOTE  Pt complete and at +2 station with urge to push. Epidural controlling pain. Pt pushed and delivered a viable female infant in LOA position. Nuchal x1, easily reduced. Anterior and posterior shoulders spontaneously delivered with next two pushes; body easily followed next. Infant placed on mothers abdomen and bulb suction of mouth and nose performed. Cord was then clamped and cut by patient's mother. Cord blood obtained, 3VC. Baby had a vigorous cry noted after stimulation. Placenta then delivered at 2247 intact. Fundal massage performed and pitocin per protocol. Fundus firm. However, brisk gush of blood noted after placental delivery. LUS atony noted. IM methergine, 1g TXA and PR cytotec administered with drastic improvement in bleeding. Emesis x1, 300cc urine via red rubber. Total EBL 650cc. The following lacerations were noted: NONE. Mother and baby stable. Counts correct. IV clinda 900mg  x1 given for manual sweep.  Infant time: 2244 Gender: female Placenta time: 2247 Apgars: 8/9  Weight: pending skin-to-skin Weight: 3430g

## 2021-04-10 ENCOUNTER — Other Ambulatory Visit: Payer: Self-pay | Admitting: Obstetrics and Gynecology

## 2021-04-13 ENCOUNTER — Encounter (HOSPITAL_COMMUNITY): Payer: Self-pay | Admitting: *Deleted

## 2021-04-13 ENCOUNTER — Encounter (HOSPITAL_COMMUNITY): Payer: Self-pay

## 2021-04-13 ENCOUNTER — Telehealth (HOSPITAL_COMMUNITY): Payer: Self-pay | Admitting: *Deleted

## 2021-04-13 NOTE — Telephone Encounter (Signed)
Preadmission screen  

## 2021-04-15 ENCOUNTER — Other Ambulatory Visit: Payer: Self-pay | Admitting: Obstetrics and Gynecology

## 2021-04-15 ENCOUNTER — Encounter (HOSPITAL_COMMUNITY): Payer: Self-pay | Admitting: Obstetrics and Gynecology

## 2021-04-16 LAB — SARS CORONAVIRUS 2 (TAT 6-24 HRS): SARS Coronavirus 2: NEGATIVE

## 2021-04-17 ENCOUNTER — Other Ambulatory Visit: Payer: Self-pay

## 2021-04-18 ENCOUNTER — Inpatient Hospital Stay (HOSPITAL_COMMUNITY): Payer: BC Managed Care – PPO

## 2021-04-18 ENCOUNTER — Encounter (HOSPITAL_COMMUNITY): Payer: Self-pay | Admitting: Obstetrics and Gynecology

## 2021-04-18 ENCOUNTER — Inpatient Hospital Stay (HOSPITAL_COMMUNITY)
Admission: AD | Admit: 2021-04-18 | Discharge: 2021-04-20 | DRG: 806 | Disposition: A | Discharge status: Home / Self Care | Payer: BC Managed Care – PPO | Attending: Obstetrics and Gynecology | Admitting: Obstetrics and Gynecology

## 2021-04-18 ENCOUNTER — Inpatient Hospital Stay (HOSPITAL_COMMUNITY): Payer: BC Managed Care – PPO | Admitting: Anesthesiology

## 2021-04-18 DIAGNOSIS — D62 Acute posthemorrhagic anemia: Secondary | ICD-10-CM | POA: Diagnosis not present

## 2021-04-18 DIAGNOSIS — O9081 Anemia of the puerperium: Secondary | ICD-10-CM | POA: Diagnosis not present

## 2021-04-18 DIAGNOSIS — O26893 Other specified pregnancy related conditions, third trimester: Secondary | ICD-10-CM | POA: Diagnosis present

## 2021-04-18 DIAGNOSIS — Z3A39 39 weeks gestation of pregnancy: Secondary | ICD-10-CM | POA: Diagnosis not present

## 2021-04-18 HISTORY — DX: Depression, unspecified: F32.A

## 2021-04-18 LAB — CBC
HCT: 29.9 % — ABNORMAL LOW (ref 36.0–46.0)
Hemoglobin: 9.3 g/dL — ABNORMAL LOW (ref 12.0–15.0)
MCH: 23.7 pg — ABNORMAL LOW (ref 26.0–34.0)
MCHC: 31.1 g/dL (ref 30.0–36.0)
MCV: 76.1 fL — ABNORMAL LOW (ref 80.0–100.0)
Platelets: 191 10*3/uL (ref 150–400)
RBC: 3.93 MIL/uL (ref 3.87–5.11)
RDW: 14.4 % (ref 11.5–15.5)
WBC: 6.6 10*3/uL (ref 4.0–10.5)
nRBC: 0 % (ref 0.0–0.2)

## 2021-04-18 LAB — TYPE AND SCREEN
ABO/RH(D): A POS
Antibody Screen: NEGATIVE

## 2021-04-18 LAB — RPR: RPR Ser Ql: NONREACTIVE

## 2021-04-18 MED ORDER — OXYCODONE-ACETAMINOPHEN 5-325 MG PO TABS: 1.0000 | ORAL_TABLET | ORAL | Status: DC | PRN

## 2021-04-18 MED ORDER — ACETAMINOPHEN 325 MG PO TABS: 650.0000 mg | ORAL_TABLET | ORAL | Status: DC | PRN

## 2021-04-18 MED ORDER — TERBUTALINE SULFATE 1 MG/ML IJ SOLN: 0.2500 mg | Freq: Once | INTRAMUSCULAR | Status: DC | PRN

## 2021-04-18 MED ORDER — METHYLERGONOVINE MALEATE 0.2 MG/ML IJ SOLN: 0.2000 mg | Freq: Once | INTRAMUSCULAR | Status: DC

## 2021-04-18 MED ORDER — LACTATED RINGERS IV SOLN: INTRAVENOUS | Status: DC

## 2021-04-18 MED ORDER — EPHEDRINE 5 MG/ML INJ: 10.0000 mg | INTRAVENOUS | Status: DC | PRN

## 2021-04-18 MED ORDER — LACTATED RINGERS IV SOLN
500.0000 mL | Freq: Once | INTRAVENOUS | Status: AC
  Administered 2021-04-18: 500 mL via INTRAVENOUS

## 2021-04-18 MED ORDER — OXYTOCIN-SODIUM CHLORIDE 30-0.9 UT/500ML-% IV SOLN
1.0000 m[IU]/min | INTRAVENOUS | Status: DC
  Administered 2021-04-18: 2 m[IU]/min via INTRAVENOUS
  Filled 2021-04-18: qty 500

## 2021-04-18 MED ORDER — MISOPROSTOL 200 MCG PO TABS
ORAL_TABLET | ORAL | Status: AC
  Filled 2021-04-18: qty 5

## 2021-04-18 MED ORDER — SOD CITRATE-CITRIC ACID 500-334 MG/5ML PO SOLN
30.0000 mL | ORAL | Status: DC | PRN
  Administered 2021-04-18: 30 mL via ORAL
  Filled 2021-04-18: qty 30

## 2021-04-18 MED ORDER — ONDANSETRON HCL 4 MG/2ML IJ SOLN
4.0000 mg | Freq: Four times a day (QID) | INTRAMUSCULAR | Status: DC | PRN
  Administered 2021-04-18 (×2): 4 mg via INTRAVENOUS
  Filled 2021-04-18 (×2): qty 2

## 2021-04-18 MED ORDER — OXYTOCIN BOLUS FROM INFUSION
333.0000 mL | Freq: Once | INTRAVENOUS | Status: AC
  Administered 2021-04-18: 333 mL via INTRAVENOUS

## 2021-04-18 MED ORDER — METHYLERGONOVINE MALEATE 0.2 MG/ML IJ SOLN
INTRAMUSCULAR | Status: AC
  Administered 2021-04-18: 0.2 mg
  Filled 2021-04-18: qty 1

## 2021-04-18 MED ORDER — BUTORPHANOL TARTRATE 1 MG/ML IJ SOLN
1.0000 mg | INTRAMUSCULAR | Status: DC | PRN
  Administered 2021-04-18: 1 mg via INTRAVENOUS
  Filled 2021-04-18: qty 1

## 2021-04-18 MED ORDER — CLINDAMYCIN PHOSPHATE 900 MG/50ML IV SOLN
900.0000 mg | Freq: Once | INTRAVENOUS | Status: AC
  Administered 2021-04-18: 900 mg via INTRAVENOUS
  Filled 2021-04-18: qty 50

## 2021-04-18 MED ORDER — MISOPROSTOL 25 MCG QUARTER TABLET
25.0000 ug | ORAL_TABLET | ORAL | Status: DC | PRN
  Administered 2021-04-18 (×2): 25 ug via VAGINAL
  Filled 2021-04-18 (×2): qty 1

## 2021-04-18 MED ORDER — TRANEXAMIC ACID-NACL 1000-0.7 MG/100ML-% IV SOLN
INTRAVENOUS | Status: AC
  Administered 2021-04-18: 1000 mg
  Filled 2021-04-18: qty 100

## 2021-04-18 MED ORDER — PHENYLEPHRINE 40 MCG/ML (10ML) SYRINGE FOR IV PUSH (FOR BLOOD PRESSURE SUPPORT)
80.0000 ug | PREFILLED_SYRINGE | INTRAVENOUS | Status: DC | PRN
  Administered 2021-04-18: 80 ug via INTRAVENOUS
  Filled 2021-04-18: qty 10

## 2021-04-18 MED ORDER — TRANEXAMIC ACID-NACL 1000-0.7 MG/100ML-% IV SOLN: 1000.0000 mg | INTRAVENOUS | Status: AC

## 2021-04-18 MED ORDER — LIDOCAINE HCL (PF) 1 % IJ SOLN: 30.0000 mL | INTRAMUSCULAR | Status: DC | PRN

## 2021-04-18 MED ORDER — LACTATED RINGERS IV SOLN: 500.0000 mL | INTRAVENOUS | Status: DC | PRN

## 2021-04-18 MED ORDER — DIPHENHYDRAMINE HCL 50 MG/ML IJ SOLN: 12.5000 mg | INTRAMUSCULAR | Status: DC | PRN

## 2021-04-18 MED ORDER — MISOPROSTOL 200 MCG PO TABS
1000.0000 ug | ORAL_TABLET | Freq: Once | ORAL | Status: AC
  Administered 2021-04-18: 1000 ug via RECTAL

## 2021-04-18 MED ORDER — LIDOCAINE HCL (PF) 1 % IJ SOLN
INTRAMUSCULAR | Status: DC | PRN
  Administered 2021-04-18: 10 mL via EPIDURAL
  Administered 2021-04-18: 3 mL via EPIDURAL

## 2021-04-18 MED ORDER — OXYCODONE-ACETAMINOPHEN 5-325 MG PO TABS: 2.0000 | ORAL_TABLET | ORAL | Status: DC | PRN

## 2021-04-18 MED ORDER — TRANEXAMIC ACID-NACL 1000-0.7 MG/100ML-% IV SOLN: 1000.0000 mg | Freq: Once | INTRAVENOUS | Status: DC | PRN

## 2021-04-18 MED ORDER — OXYTOCIN-SODIUM CHLORIDE 30-0.9 UT/500ML-% IV SOLN
2.5000 [IU]/h | INTRAVENOUS | Status: DC
  Administered 2021-04-18: 2.5 [IU]/h via INTRAVENOUS

## 2021-04-18 MED ORDER — PHENYLEPHRINE 40 MCG/ML (10ML) SYRINGE FOR IV PUSH (FOR BLOOD PRESSURE SUPPORT)
80.0000 ug | PREFILLED_SYRINGE | INTRAVENOUS | Status: DC | PRN
  Administered 2021-04-18: 80 ug via INTRAVENOUS

## 2021-04-18 MED ORDER — FENTANYL-BUPIVACAINE-NACL 0.5-0.125-0.9 MG/250ML-% EP SOLN
12.0000 mL/h | EPIDURAL | Status: DC | PRN
  Administered 2021-04-18: 12 mL/h via EPIDURAL
  Filled 2021-04-18: qty 250

## 2021-04-18 NOTE — Lactation Note (Signed)
This note was copied from a baby's chart. Lactation Consultation Note Assisted baby to breast. Baby latched right away but not holding seal. Making lots of noises while suckling. Mom denies painful latch. LC flanged lips, chin tug, compressed breast to assist in sealing corners of baby's mouth. Noted slight dimpling. Baby BF steady w/few pauses. Newborn feeding behavior reviewed briefly. Mom nauseated. Has received medication for nausea.  Mom has 26 yr old that she didn't BF long at all. Mom will be f/u on MBU.  Patient Name: Girl Jaylah Goodlow [id number] Date: 04/18/2021 Reason for consult: L&D Initial assessment;Term Age:80 hours  Maternal Data    Feeding    LATCH Score Latch: Grasps breast easily, tongue down, lips flanged, rhythmical sucking.  Audible Swallowing: None  Type of Nipple: Everted at rest and after stimulation  Comfort (Breast/Nipple): Soft / non-tender  Hold (Positioning): Assistance needed to correctly position infant at breast and maintain latch.  LATCH Score: 7   Lactation Tools Discussed/Used    Interventions    Discharge    Consult Status Consult Status: Follow-up from L&D Date: 04/19/21 Follow-up type: In-patient    Charyl Dancer 04/18/2021, 11:59 PM

## 2021-04-18 NOTE — Progress Notes (Signed)
Patient called out from toilet with VB. On exam, CE 2/50/-3, no active VB, scan old dark brown clot in posterior fornix. Will begin pitocin at this time, plan for epidural. AROM when more comfortable. Category 1 tracing BP (!) 142/86    Pulse 86    Temp 98.6 F (37 C) (Oral)    Resp 18    Ht 5\' 3"  (1.6 m)    Wt 100.3 kg    LMP 07/14/2020    BMI 39.17 kg/m  Last BP elevated likely 2/2 pain with exam, however if persistent will order PreE labs

## 2021-04-18 NOTE — Anesthesia Procedure Notes (Addendum)
Epidural Patient location during procedure: OB Start time: 04/18/2021 10:11 AM End time: 04/18/2021 10:29 AM  Staffing Anesthesiologist: Lucretia Kern, MD Performed: anesthesiologist   Preanesthetic Checklist Completed: patient identified, IV checked, risks and benefits discussed, monitors and equipment checked, pre-op evaluation and timeout performed  Epidural Patient position: sitting Prep: DuraPrep Patient monitoring: heart rate, continuous pulse ox and blood pressure Approach: midline Location: L3-L4 Injection technique: LOR air  Needle:  Needle type: Tuohy  Needle gauge: 17 G Needle length: 9 cm Needle insertion depth: 8 cm Catheter type: closed end flexible Catheter size: 19 Gauge Catheter at skin depth: 13 cm Test dose: negative  Assessment Events: blood not aspirated, injection not painful, no injection resistance, no paresthesia and negative IV test  Additional Notes First attempt LOR at 9, needle bolused with 10 mL 1% lidocaine and no resistance to injection, however catheter would not thread. Reattempted at slightly different location with crisp LOR at 8 cm. Catheter threaded very easily and bolused with additional 3 mL. No signs of intrathecal or intravascular injection. Reason for block:procedure for pain

## 2021-04-18 NOTE — Progress Notes (Signed)
Category 1 tracing with early decels.  BP (!) 123/92    Pulse 91    Temp 98.6 F (37 C) (Oral)    Resp 16    Ht 5\' 3"  (1.6 m)    Wt 100.3 kg    LMP 07/14/2020    BMI 39.17 kg/m  TOCO q2-43m, pitocin at 47mU/min CE 8/80/-1  Anticipate SVD

## 2021-04-18 NOTE — Anesthesia Preprocedure Evaluation (Signed)
Anesthesia Evaluation  Patient identified by MRN, date of birth, ID band Patient awake    Reviewed: Allergy & Precautions, H&P , NPO status , Patient's Chart, lab work & pertinent test results  History of Anesthesia Complications Negative for: history of anesthetic complications  Airway Mallampati: II  TM Distance: >3 FB     Dental   Pulmonary neg pulmonary ROS,    Pulmonary exam normal        Cardiovascular negative cardio ROS   Rhythm:regular Rate:Normal     Neuro/Psych Depression negative neurological ROS  negative psych ROS   GI/Hepatic negative GI ROS, Neg liver ROS,   Endo/Other  negative endocrine ROS  Renal/GU negative Renal ROS  negative genitourinary   Musculoskeletal   Abdominal   Peds  Hematology  (+) Blood dyscrasia, anemia ,   Anesthesia Other Findings   Reproductive/Obstetrics (+) Pregnancy                             Anesthesia Physical Anesthesia Plan  ASA: 2  Anesthesia Plan: Epidural   Post-op Pain Management:    Induction:   PONV Risk Score and Plan:   Airway Management Planned:   Additional Equipment:   Intra-op Plan:   Post-operative Plan:   Informed Consent: I have reviewed the patients History and Physical, chart, labs and discussed the procedure including the risks, benefits and alternatives for the proposed anesthesia with the patient or authorized representative who has indicated his/her understanding and acceptance.       Plan Discussed with:   Anesthesia Plan Comments:         Anesthesia Quick Evaluation

## 2021-04-18 NOTE — Progress Notes (Signed)
S/p epidural, comfortable BP (!) 119/59    Pulse 72    Temp 98.6 F (37 C) (Oral)    Resp 18    Ht 5\' 3"  (1.6 m)    Wt 100.3 kg    LMP 07/14/2020    BMI 39.17 kg/m  CE 2/50/-2, clear AROM 1215, softer and more mid-position, copious clear fluidTOCO q2-107m, occ couplets, pitocin at 39mU/min Cat 1 tracing  Continue to titrate per protocol, normotensive since epidural placement

## 2021-04-18 NOTE — H&P (Signed)
Emmalene Alfonso Ramus is a 26 y.o. female presenting for scheduled induction of labor. +FM, denies VB, LOF, has int ctx.  PNC c/b: 1) BMI 36 - baseline A1c 5.3, declined baby ASA 2) H/o depression - formerly on Zoloft 3) IPV - From 1st FOB family as well as current FOB. Has requested current FOB see baby only after delivery while she is not present because of this. Currently with mother in labor room  GBS neg OB History     Gravida  2   Para  1   Term  1   Preterm  0   AB  0   Living  1      SAB  0   IAB  0   Ectopic  0   Multiple  0   Live Births  1          Past Medical History:  Diagnosis Date   Abdominal pain, recurrent    characterized as crampy, spasmodic pain   Asthma    as a child- not since 2010   Chlamydia 08/2020   during pregnancy   Chronic constipation    Depression    Eczema    Past Surgical History:  Procedure Laterality Date   HERNIA REPAIR  1998   Family History: family history includes Asthma in her brother and mother; Cancer in her maternal grandmother; Diabetes in her father and paternal grandfather; Irritable bowel syndrome in her father; Lactose intolerance in her mother. Social History:  reports that she has never smoked. She has never used smokeless tobacco. She reports that she does not drink alcohol and does not use drugs.     Maternal Diabetes: No1hr 114 Genetic Screening: Normal Maternal Ultrasounds/Referrals: Normal Fetal Ultrasounds or other Referrals:  None Maternal Substance Abuse:  No Significant Maternal Medications:  None Significant Maternal Lab Results:  Group B Strep negative Other Comments:  None  Review of Systems  Constitutional:  Negative for chills and fever.  Respiratory:  Negative for shortness of breath.   Cardiovascular:  Negative for chest pain, palpitations and leg swelling.  Gastrointestinal:  Negative for abdominal pain and vomiting.  Neurological:  Negative for dizziness, weakness and headaches.   Psychiatric/Behavioral:  Negative for suicidal ideas.   History Dilation: 1 Effacement (%): 50 Station: Ballotable Exam by:: Mellissa Kohut, RN Blood pressure 139/77, pulse 74, temperature 98.7 F (37.1 C), temperature source Oral, resp. rate 20, height 5\' 3"  (1.6 m), weight 100.3 kg, last menstrual period 07/14/2020, unknown if currently breastfeeding. Exam Physical Exam Constitutional:      General: She is not in acute distress.    Appearance: She is well-developed.  HENT:     Head: Normocephalic and atraumatic.  Eyes:     Pupils: Pupils are equal, round, and reactive to light.  Cardiovascular:     Rate and Rhythm: Normal rate and regular rhythm.     Heart sounds: No murmur heard.   No gallop.  Abdominal:     Tenderness: There is no abdominal tenderness. There is no guarding or rebound.  Genitourinary:    Vagina: Normal.  Musculoskeletal:        General: Normal range of motion.     Cervical back: Normal range of motion and neck supple.  Skin:    General: Skin is warm and dry.  Neurological:     Mental Status: She is alert and oriented to person, place, and time.    Prenatal labs: ABO, Rh: --/--/A POS (01/16 0028) Antibody: NEG (  01/16 0028) Rubella: Immune (06/20 0000) RPR: Nonreactive (06/20 0000)  HBsAg: Negative (06/20 0000)  HIV: Non-reactive (06/20 0000)  GBS: Negative/-- (12/28 0000)  Category 1 tracing, somewhat broken in spots 2/2 birthing ball and ambulation TOCO q2-24m  Assessment/Plan: This is a 25yo G2P1001 @ 39 5/7 by LMP c/w 9wk TVUS admitted for IOL at term. PV cytotec x2, CE due at 0915. Pelvis proven to 7lb6oz, GBS neg. Plan for [facility] when amenable, pitocin per protocol. Anticipate SVD  Valerie Roys Abdulrahman Bracey 04/18/2021, 8:17 AM

## 2021-04-19 ENCOUNTER — Encounter (HOSPITAL_COMMUNITY): Payer: Self-pay | Admitting: Obstetrics and Gynecology

## 2021-04-19 LAB — CBC
HCT: 24.4 % — ABNORMAL LOW (ref 36.0–46.0)
Hemoglobin: 8 g/dL — ABNORMAL LOW (ref 12.0–15.0)
MCH: 24.2 pg — ABNORMAL LOW (ref 26.0–34.0)
MCHC: 32.8 g/dL (ref 30.0–36.0)
MCV: 73.7 fL — ABNORMAL LOW (ref 80.0–100.0)
Platelets: 152 10*3/uL (ref 150–400)
RBC: 3.31 MIL/uL — ABNORMAL LOW (ref 3.87–5.11)
RDW: 14.2 % (ref 11.5–15.5)
WBC: 9.1 10*3/uL (ref 4.0–10.5)
nRBC: 0 % (ref 0.0–0.2)

## 2021-04-19 LAB — BIRTH TISSUE RECOVERY COLLECTION (PLACENTA DONATION)

## 2021-04-19 MED ORDER — IBUPROFEN 600 MG PO TABS
600.0000 mg | ORAL_TABLET | Freq: Four times a day (QID) | ORAL | Status: DC
  Administered 2021-04-19 – 2021-04-20 (×7): 600 mg via ORAL
  Filled 2021-04-19 (×7): qty 1

## 2021-04-19 MED ORDER — DIBUCAINE (PERIANAL) 1 % EX OINT: 1.0000 "application " | TOPICAL_OINTMENT | CUTANEOUS | Status: DC | PRN

## 2021-04-19 MED ORDER — TETANUS-DIPHTH-ACELL PERTUSSIS 5-2.5-18.5 LF-MCG/0.5 IM SUSY: 0.5000 mL | PREFILLED_SYRINGE | Freq: Once | INTRAMUSCULAR | Status: DC

## 2021-04-19 MED ORDER — ONDANSETRON HCL 4 MG PO TABS: 4.0000 mg | ORAL_TABLET | ORAL | Status: DC | PRN

## 2021-04-19 MED ORDER — WITCH HAZEL-GLYCERIN EX PADS: 1.0000 "application " | MEDICATED_PAD | CUTANEOUS | Status: DC | PRN

## 2021-04-19 MED ORDER — PRENATAL MULTIVITAMIN CH
1.0000 | ORAL_TABLET | Freq: Every day | ORAL | Status: DC
  Administered 2021-04-19 – 2021-04-20 (×2): 1 via ORAL
  Filled 2021-04-19 (×2): qty 1

## 2021-04-19 MED ORDER — ONDANSETRON HCL 4 MG/2ML IJ SOLN: 4.0000 mg | INTRAMUSCULAR | Status: DC | PRN

## 2021-04-19 MED ORDER — COCONUT OIL OIL: 1.0000 "application " | TOPICAL_OIL | Status: DC | PRN

## 2021-04-19 MED ORDER — FERROUS SULFATE 325 (65 FE) MG PO TABS
325.0000 mg | ORAL_TABLET | Freq: Every day | ORAL | Status: DC
  Administered 2021-04-19 – 2021-04-20 (×2): 325 mg via ORAL
  Filled 2021-04-19 (×2): qty 1

## 2021-04-19 MED ORDER — ZOLPIDEM TARTRATE 5 MG PO TABS: 5.0000 mg | ORAL_TABLET | Freq: Every evening | ORAL | Status: DC | PRN

## 2021-04-19 MED ORDER — SIMETHICONE 80 MG PO CHEW: 80.0000 mg | CHEWABLE_TABLET | ORAL | Status: DC | PRN

## 2021-04-19 MED ORDER — SENNOSIDES-DOCUSATE SODIUM 8.6-50 MG PO TABS
2.0000 | ORAL_TABLET | Freq: Every day | ORAL | Status: DC
  Administered 2021-04-19 – 2021-04-20 (×2): 2 via ORAL
  Filled 2021-04-19 (×2): qty 2

## 2021-04-19 MED ORDER — ACETAMINOPHEN 325 MG PO TABS
650.0000 mg | ORAL_TABLET | ORAL | Status: DC | PRN
  Administered 2021-04-19 – 2021-04-20 (×4): 650 mg via ORAL
  Filled 2021-04-19 (×4): qty 2

## 2021-04-19 MED ORDER — DIPHENHYDRAMINE HCL 25 MG PO CAPS: 25.0000 mg | ORAL_CAPSULE | Freq: Four times a day (QID) | ORAL | Status: DC | PRN

## 2021-04-19 MED ORDER — BENZOCAINE-MENTHOL 20-0.5 % EX AERO
1.0000 "application " | INHALATION_SPRAY | CUTANEOUS | Status: DC | PRN
  Administered 2021-04-19: 1 via TOPICAL
  Filled 2021-04-19: qty 56

## 2021-04-19 NOTE — Progress Notes (Signed)
Post Partum Day 1 Subjective: Tina Dickson is doing well this morning. Reports mild uterine cramping that is controlled with ibuprofen/tylenol. Ambulating, voiding, tolerating PO. Minimal lochia.   Objective: Patient Vitals for the past 24 hrs:  BP Temp Temp src Pulse Resp SpO2  04/19/21 0920 122/74 98 F (36.7 C) Oral 74 18 98 %  04/19/21 0545 132/80 98.4 F (36.9 C) Oral 78 18 100 %  04/19/21 0147 130/74 100.3 F (37.9 C) Oral 78 16 100 %  04/19/21 0044 136/80 100 F (37.8 C) Oral 75 16 100 %  04/19/21 0020 -- 100.3 F (37.9 C) Oral -- 16 --  04/19/21 0017 (!) 123/59 -- -- 100 -- --  04/19/21 0003 109/86 -- -- (!) 114 -- --  04/18/21 2347 128/74 -- -- 98 -- --  04/18/21 2346 128/74 -- -- 98 -- --  04/18/21 2332 124/78 -- -- (!) 106 -- --  04/18/21 2317 108/89 -- -- (!) 150 -- --  04/18/21 2302 126/71 -- -- (!) 119 -- --  04/18/21 2249 (!) 127/94 -- -- (!) 107 -- --  04/18/21 2232 132/67 -- -- (!) 123 -- --  04/18/21 2202 137/85 -- -- 100 -- --  04/18/21 2132 134/73 -- -- (!) 101 -- --  04/18/21 2102 131/66 -- -- 84 -- --  04/18/21 2032 119/73 98.4 F (36.9 C) Oral 68 16 --  04/18/21 2002 119/77 -- -- 75 -- --  04/18/21 1932 104/90 -- -- 79 -- --  04/18/21 1902 (!) 123/92 98.6 F (37 C) Oral -- 16 --  04/18/21 1832 116/67 -- -- 91 -- --  04/18/21 1802 (!) 115/59 -- -- 76 -- --  04/18/21 1732 (!) 114/47 -- -- 86 -- --  04/18/21 1725 -- 98.2 F (36.8 C) Oral -- 16 --  04/18/21 1702 130/77 -- -- 83 -- --  04/18/21 1632 121/70 -- -- 82 -- --  04/18/21 1602 127/80 -- -- 91 -- --  04/18/21 1532 129/64 -- -- 68 -- --  04/18/21 1502 128/76 -- -- 94 -- --  04/18/21 1432 121/79 -- -- 92 -- --  04/18/21 1402 125/69 -- -- 82 -- --  04/18/21 1332 121/63 -- -- 77 -- --  04/18/21 1302 118/64 98.1 F (36.7 C) Oral 77 -- --  04/18/21 1232 118/67 -- -- 78 -- --  04/18/21 1202 (!) 119/59 -- -- 72 -- --  04/18/21 1132 123/61 -- -- 76 -- --    Physical Exam:  General: alert,  cooperative, and no distress Lochia: appropriate Uterine Fundus: firm DVT Evaluation: No evidence of DVT seen on physical exam.  Recent Labs    04/18/21 0028 04/19/21 0532  WBC 6.6 9.1  HGB 9.3* 8.0*  HCT 29.9* 24.4*  PLT 191 152    No results for input(s): NA, K, CL, CO2CT, BUN, CREATININE, GLUCOSE, BILITOT, ALT, AST, ALKPHOS, PROT, ALBUMIN in the last 72 hours.  No results for input(s): CALCIUM, MG, PHOS in the last 72 hours.  No results for input(s): PROTIME, APTT, INR in the last 72 hours.  No results for input(s): PROTIME, APTT, INR, FIBRINOGEN in the last 72 hours. Assessment/Plan:  Tina Dickson 25 y.o. [id number] PPD#1 sp SVD 1. PPC: continue routine PP care 2. H/o IPV: SW consult pending 3. ABLA: PO iron 4. Rh pos, rubella immune 5. Dispo: anticipate d/c home tomorrow   LOS: 1 day   Rowland Lathe 04/19/2021, 11:26 AM

## 2021-04-19 NOTE — Lactation Note (Signed)
This note was copied from a baby's chart. Lactation Consultation Note Mom has decided just to formula feed.  Patient Name: Girl Leonila Speranza Today's Date: 04/19/2021 Reason for consult: Initial assessment Age:26 hours  Maternal Data    Feeding Mother's Current Feeding Choice: Formula  LATCH Score Latch: Grasps breast easily, tongue down, lips flanged, rhythmical sucking.  Audible Swallowing: A few with stimulation  Type of Nipple: Everted at rest and after stimulation  Comfort (Breast/Nipple): Soft / non-tender  Hold (Positioning): Assistance needed to correctly position infant at breast and maintain latch.  LATCH Score: 8   Lactation Tools Discussed/Used    Interventions Interventions: Breast feeding basics reviewed;Assisted with latch;Skin to skin;Hand express;Support pillows;Position options;Expressed milk  Discharge    Consult Status Consult Status: Complete Date: 04/19/21    Charyl Dancer 04/19/2021, 4:34 AM

## 2021-04-19 NOTE — Anesthesia Postprocedure Evaluation (Signed)
Anesthesia Post Note  Patient: Tina Dickson  Procedure(s) Performed: AN AD HOC LABOR EPIDURAL     Patient location during evaluation: Mother Baby Anesthesia Type: Epidural Level of consciousness: awake and alert and oriented Pain management: satisfactory to patient Vital Signs Assessment: post-procedure vital signs reviewed and stable Respiratory status: respiratory function stable Cardiovascular status: stable Postop Assessment: no headache, no backache, epidural receding, patient able to bend at knees, no signs of nausea or vomiting, adequate PO intake and able to ambulate Anesthetic complications: no   No notable events documented.  Last Vitals:  Vitals:   04/19/21 0920 04/19/21 1316  BP: 122/74 126/74  Pulse: 74 70  Resp: 18 16  Temp: 36.7 C 36.8 C  SpO2: 98%     Last Pain:  Vitals:   04/19/21 1316  TempSrc: Axillary  PainSc: 2    Pain Goal: Patients Stated Pain Goal: 3 (04/19/21 1316)                 Omesha Bowerman
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
## 2021-04-20 MED ORDER — IBUPROFEN 600 MG PO TABS: 600.0000 mg | ORAL_TABLET | Freq: Four times a day (QID) | ORAL | 1 refills | Status: DC | PRN

## 2021-04-20 MED ORDER — FERROUS SULFATE 325 (65 FE) MG PO TABS: 325.0000 mg | ORAL_TABLET | Freq: Every day | ORAL | 3 refills | Status: DC

## 2021-04-20 NOTE — Discharge Summary (Signed)
Postpartum Discharge Summary  Date of Service updated      Patient Name: Tina Dickson DOB: 1995-12-07 MRN: [id number]  Date of admission: 04/18/2021 Delivery date:04/18/2021  Delivering provider: Carlisle Cater  Date of discharge: 04/20/2021  Admitting diagnosis: [redacted] weeks gestation of pregnancy [Z3A.39] Intrauterine pregnancy: [redacted]w[redacted]d     Secondary diagnosis:  Principal Problem:   [redacted] weeks gestation of pregnancy  Additional problems: anemia of pregnancy    Discharge diagnosis: Term Pregnancy Delivered                                              Post partum procedures: n/a Augmentation: AROM, Pitocin, and Cytotec Complications: None  Hospital course: Induction of Labor With Vaginal Delivery   26 y.o. yo [id number] at [redacted]w[redacted]d was admitted to the hospital 04/18/2021 for induction of labor.  Indication for induction: Favorable cervix at term.  Patient had an uncomplicated labor course as follows: Membrane Rupture Time/Date: 12:17 PM ,04/18/2021   Delivery Method:Vaginal, Spontaneous  Episiotomy: None  Lacerations:  None  Details of delivery can be found in separate delivery note.  Patient had a routine postpartum course. Patient is discharged home 04/20/21.  Newborn Data: Birth date:04/18/2021  Birth time:10:44 PM  Gender:Female  Living status:Living  Apgars:8 ,9  Weight:3430 g   Magnesium Sulfate received: No BMZ received: No  Physical exam  Vitals:   04/19/21 0920 04/19/21 1316 04/19/21 2125 04/20/21 0548  BP: 122/74 126/74 107/62 139/76  Pulse: 74 70 71   Resp: 18 16 18    Temp: 98 F (36.7 C) 98.2 F (36.8 C) 97.9 F (36.6 C)   TempSrc: Oral Axillary Oral   SpO2: 98%  99%   Weight:      Height:       General: alert, cooperative, and no distress Lochia: appropriate Uterine Fundus: firm Incision: N/A DVT Evaluation: No evidence of DVT seen on physical exam. Labs: Lab Results  Component Value Date   WBC 9.1 04/19/2021   HGB 8.0 (L) 04/19/2021   HCT  24.4 (L) 04/19/2021   MCV 73.7 (L) 04/19/2021   PLT 152 04/19/2021   CMP Latest Ref Rng & Units 03/28/2021  Glucose 70 - 99 mg/dL 80  BUN 6 - 20 mg/dL 5(L)  Creatinine 03/30/2021 - 1.00 mg/dL 9.62  Sodium 9.52 - 841 mmol/L 136  Potassium 3.5 - 5.1 mmol/L 3.8  Chloride 98 - 111 mmol/L 106  CO2 22 - 32 mmol/L 19(L)  Calcium 8.9 - 10.3 mg/dL 9.1  Total Protein 6.5 - 8.1 g/dL 7.3  Total Bilirubin 0.3 - 1.2 mg/dL 0.6  Alkaline Phos 38 - 126 U/L 118  AST 15 - 41 U/L 19  ALT 0 - 44 U/L 14   Edinburgh Score: Edinburgh Postnatal Depression Scale Screening Tool 12/29/2016  I have been able to laugh and see the funny side of things. 0  I have looked forward with enjoyment to things. 0  I have blamed myself unnecessarily when things went wrong. 0  I have been anxious or worried for no good reason. 0  I have felt scared or panicky for no good reason. 0  Things have been getting on top of me. 0  I have been so unhappy that I have had difficulty sleeping. 0  I have felt sad or miserable. 1  I have been so unhappy that  I have been crying. 0  The thought of harming myself has occurred to me. 0  Edinburgh Postnatal Depression Scale Total 1      After visit meds:  Allergies as of 04/20/2021       Reactions   Penicillins Other (See Comments)   Gets severe yeast infections with cillins        Medication List     TAKE these medications    acetaminophen 500 MG tablet Commonly known as: TYLENOL Take 1,000 mg by mouth every 6 (six) hours as needed.   butalbital-acetaminophen-caffeine 50-325-40 MG tablet Commonly known as: FIORICET Take 1-2 tablets by mouth every 6 (six) hours as needed for headache.   ferrous sulfate 325 (65 FE) MG tablet Take 1 tablet (325 mg total) by mouth daily with breakfast.   ibuprofen 600 MG tablet Commonly known as: ADVIL Take 1 tablet (600 mg total) by mouth every 6 (six) hours as needed for moderate pain or cramping.   ondansetron 4 MG disintegrating  tablet Commonly known as: ZOFRAN-ODT Take 1 tablet (4 mg total) by mouth every 6 (six) hours as needed for nausea.   prenatal multivitamin Tabs tablet Take 1 tablet by mouth daily at 12 noon.         Discharge home in stable condition Infant Feeding: Bottle and Breast Infant Disposition:home with mother Discharge instruction: per After Visit Summary and Postpartum booklet. Activity: Advance as tolerated. Pelvic rest for 6 weeks.  Diet: routine diet Anticipated Birth Control: Unsure Postpartum Appointment:6 weeks Additional Postpartum F/U: Postpartum Depression checkup Future Appointments:No future appointments. Follow up Visit:  Follow-up Information     [facility] Schedule an appointment as soon as possible for a visit in 6 week(s).   Why: For postpartum visit Contact information: [address]  [address] [place] [address] [phone]                     04/20/2021 Cathrine Muster, DO

## 2021-04-20 NOTE — Progress Notes (Signed)
Post Partum Day 2 Subjective: no complaints, up ad lib, voiding, tolerating PO, + flatus, and lochia mild. She denies HA, blurry vision, lightheadedness. She is bonding well with baby - breast/bottlefeeding. Feels ready for discharge to home today  Objective: Blood pressure 139/76, pulse 71, temperature 97.9 F (36.6 C), temperature source Oral, resp. rate 18, height 5\' 3"  (1.6 m), weight 100.3 kg, last menstrual period 07/14/2020, SpO2 99 %, unknown if currently breastfeeding.  Physical Exam:  General: alert, cooperative, and no distress Lochia: appropriate Uterine Fundus: firm Incision: n/a DVT Evaluation: No evidence of DVT seen on physical exam.  Recent Labs    04/18/21 0028 04/19/21 0532  HGB 9.3* 8.0*  HCT 29.9* 24.4*    Assessment/Plan: Discharge home and Breastfeeding Unsure contraception Will call in rx for iron supps and ibuprofen 6 week pp visit advised   LOS: 2 days   Tina Dickson 04/20/2021, 9:44 AM

## 2021-04-20 NOTE — Discharge Instructions (Signed)
Call office with any concerns [phone] 

## 2021-04-26 ENCOUNTER — Inpatient Hospital Stay (HOSPITAL_COMMUNITY)
Admission: AD | Admit: 2021-04-26 | Discharge: 2021-04-26 | Disposition: A | Discharge status: Home / Self Care | Payer: BC Managed Care – PPO | Attending: Obstetrics and Gynecology | Admitting: Obstetrics and Gynecology

## 2021-04-26 ENCOUNTER — Other Ambulatory Visit: Payer: Self-pay

## 2021-04-26 ENCOUNTER — Inpatient Hospital Stay (HOSPITAL_COMMUNITY): Payer: BC Managed Care – PPO

## 2021-04-26 ENCOUNTER — Encounter (HOSPITAL_COMMUNITY): Payer: Self-pay | Admitting: Obstetrics and Gynecology

## 2021-04-26 DIAGNOSIS — Z3A39 39 weeks gestation of pregnancy: Secondary | ICD-10-CM | POA: Diagnosis not present

## 2021-04-26 DIAGNOSIS — R109 Unspecified abdominal pain: Secondary | ICD-10-CM | POA: Diagnosis present

## 2021-04-26 DIAGNOSIS — N939 Abnormal uterine and vaginal bleeding, unspecified: Secondary | ICD-10-CM

## 2021-04-26 DIAGNOSIS — Z880 Allergy status to penicillin: Secondary | ICD-10-CM | POA: Diagnosis not present

## 2021-04-26 DIAGNOSIS — R52 Pain, unspecified: Secondary | ICD-10-CM | POA: Diagnosis not present

## 2021-04-26 DIAGNOSIS — O99893 Other specified diseases and conditions complicating puerperium: Secondary | ICD-10-CM | POA: Diagnosis not present

## 2021-04-26 DIAGNOSIS — R1030 Lower abdominal pain, unspecified: Secondary | ICD-10-CM

## 2021-04-26 DIAGNOSIS — O9089 Other complications of the puerperium, not elsewhere classified: Secondary | ICD-10-CM | POA: Diagnosis not present

## 2021-04-26 LAB — CBC
HCT: 26.6 % — ABNORMAL LOW (ref 36.0–46.0)
Hemoglobin: 8.2 g/dL — ABNORMAL LOW (ref 12.0–15.0)
MCH: 23.4 pg — ABNORMAL LOW (ref 26.0–34.0)
MCHC: 30.8 g/dL (ref 30.0–36.0)
MCV: 75.8 fL — ABNORMAL LOW (ref 80.0–100.0)
Platelets: 227 10*3/uL (ref 150–400)
RBC: 3.51 MIL/uL — ABNORMAL LOW (ref 3.87–5.11)
RDW: 14.9 % (ref 11.5–15.5)
WBC: 6 10*3/uL (ref 4.0–10.5)
nRBC: 0 % (ref 0.0–0.2)

## 2021-04-26 LAB — URINALYSIS, ROUTINE W REFLEX MICROSCOPIC
Bilirubin Urine: NEGATIVE
Glucose, UA: NEGATIVE mg/dL
Ketones, ur: NEGATIVE mg/dL
Nitrite: NEGATIVE
Protein, ur: NEGATIVE mg/dL
Specific Gravity, Urine: 1.006 (ref 1.005–1.030)
pH: 6 (ref 5.0–8.0)

## 2021-04-26 MED ORDER — OXYCODONE-ACETAMINOPHEN 5-325 MG PO TABS
2.0000 | ORAL_TABLET | Freq: Once | ORAL | Status: AC
  Administered 2021-04-26: 06:00:00 2 via ORAL
  Filled 2021-04-26: qty 2

## 2021-04-26 NOTE — MAU Provider Note (Signed)
History     CSN: [id number]  Arrival date and time: 04/26/21 0444   Event Date/Time   First Provider Initiated Contact with Patient 04/26/21 0543      Chief Complaint  Patient presents with   Abdominal Pain   HPI Tina Dickson is a 26 y.o. G2P2002 at [redacted]w[redacted]d who presents at 8 days post partum for abdominal pain & vaginal bleeding.  She had a vaginal delivery on 1/16.  States she has bled daily since delivery, but bleeding increased yesterday.  Feels of a pad every few hours and has passed a few small clots.  Blood either dark red or brown.  Abdominal pain started at 4 AM.  Reports painful lower abdominal cramping that feels like contractions.  Took dose of ibuprofen at home prior to arrival with minimal relief in symptoms.  Rates pain 9/10.  Worse when she touches her abdomen.  Nothing makes pain better.  Denies fever, dysuria, vomiting.  Has not had intercourse since delivery.  Denies complications with delivery.  OB History     Gravida  2   Para  2   Term  2   Preterm  0   AB  0   Living  2      SAB  0   IAB  0   Ectopic  0   Multiple  0   Live Births  2        Obstetric Comments  G1 - reported increased bleeding requiring meds but no transfusion, per patient         Past Medical History:  Diagnosis Date   Abdominal pain, recurrent    characterized as crampy, spasmodic pain   Asthma    as a child- not since 2010   Chlamydia 08/2020   during pregnancy   Chronic constipation    Depression    Eczema     Past Surgical History:  Procedure Laterality Date   HERNIA REPAIR  1998    Family History  Problem Relation Age of Onset   Lactose intolerance Mother    Asthma Mother    Irritable bowel syndrome Father    Diabetes Father    Asthma Brother    Cancer Maternal Grandmother    Diabetes Paternal Grandfather     Social History   Tobacco Use   Smoking status: Never   Smokeless tobacco: Never  Vaping Use   Vaping Use: Never used   Substance Use Topics   Alcohol use: No   Drug use: No    Allergies:  Allergies  Allergen Reactions   Penicillins Other (See Comments)    Gets severe yeast infections with cillins    Medications Prior to Admission  Medication Sig Dispense Refill Last Dose   acetaminophen (TYLENOL) 500 MG tablet Take 1,000 mg by mouth every 6 (six) hours as needed.   04/25/2021   butalbital-acetaminophen-caffeine (FIORICET) 50-325-40 MG tablet Take 1-2 tablets by mouth every 6 (six) hours as needed for headache. 20 tablet 0 Past Week   ferrous sulfate 325 (65 FE) MG tablet Take 1 tablet (325 mg total) by mouth daily with breakfast. 30 tablet 3 Past Week   ondansetron (ZOFRAN-ODT) 4 MG disintegrating tablet Take 1 tablet (4 mg total) by mouth every 6 (six) hours as needed for nausea. 20 tablet 0 Past Week   Prenatal Vit-Fe Fumarate-FA (PRENATAL MULTIVITAMIN) TABS tablet Take 1 tablet by mouth daily at 12 noon.   04/25/2021   ibuprofen (ADVIL) 600 MG tablet Take 1  tablet (600 mg total) by mouth every 6 (six) hours as needed for moderate pain or cramping. 40 tablet 1     Review of Systems  Constitutional: Negative.   Gastrointestinal:  Positive for abdominal pain. Negative for constipation, diarrhea, nausea and vomiting.  Genitourinary:  Positive for vaginal bleeding. Negative for dysuria and vaginal discharge.  Physical Exam   Blood pressure 120/66, pulse 94, temperature 98.2 F (36.8 C), temperature source Oral, resp. rate 17, SpO2 95 %, unknown if currently breastfeeding.  Physical Exam Constitutional:      General: She is in acute distress.     Appearance: She is well-developed.  HENT:     Head: Normocephalic and atraumatic.  Eyes:     General: No scleral icterus.    Pupils: Pupils are equal, round, and reactive to light.  Pulmonary:     Effort: Pulmonary effort is normal. No respiratory distress.  Abdominal:     Palpations: Abdomen is soft.     Tenderness: There is abdominal tenderness in  the suprapubic area. There is no guarding or rebound.  Skin:    General: Skin is warm and dry.  Neurological:     General: No focal deficit present.     Mental Status: She is alert.  Psychiatric:        Mood and Affect: Mood normal.        Behavior: Behavior normal.    MAU Course  Procedures None  MDM CBC, UA and [place] today  Patient without pain following [place], bleeding is stable and as expected for PP period Discussed patient with Dr. Rip Harbour, agrees with plan to discharge with precautions and follow-up in the office Called Dr. Rogue Bussing who will facilitate follow-up in the office, they will call the patient.   Assessment and Plan  A: Postpartum abdominal pain Postpartum bleeding   P:  Discharge home Continue Ibuprofen and Tylenol PRN for pain  Bleeding precautions and warning signs for worsening condition discussed Patient advised to follow-up with [facility] this week, they will call with an appointment Patient may return to MAU as needed or if her condition were to change or worsen  Kerry Hough 04/26/2021, 9:05 AM

## 2021-04-26 NOTE — MAU Note (Signed)
Tina Dickson is a 26 y.o.  here in MAU reporting: severe lower abdominal pain. Reports NSVD on 04/18/2021-denies complications with delivery. Pt also states vaginal bleeding has increased slightly since yesterday and she has passed 2 small clots. Pt reports she is changing her pad every 2 hours and reports pad is full when changed. She reports some mild nausea, no diarrhea. Denies fever or chills. Pt denies foul odor to lochia and it remains rubra in color.  Onset of complaint: 0400 Pain score:10 Vitals:   04/26/21 0536 04/26/21 0539  BP: 120/66   Pulse: 94   Resp: 17   Temp:  98.2 F (36.8 C)  SpO2: 95%

## 2021-05-02 ENCOUNTER — Telehealth (HOSPITAL_COMMUNITY): Payer: Self-pay

## 2021-05-02 NOTE — Telephone Encounter (Signed)
"  Doing ok. I went to the doctor today and I found out that I have some placenta left inside. So we are going to do a treatment at home to try to help that." Patient declines questions or concerns about her healing.  "She's doing great. Eating and gaining weight. Her jaundice level is coming down. She sleeps in a bassinet next to my bed." RN reviewed ABC's of safe sleep with patient. Patient declines any questions or concerns about baby.  EPDS score is 0.  Marcelino Duster [facility] 05/02/2021,1709

## 2022-04-03 NOTE — L&D Delivery Note (Signed)
Delivery Note Tina Dickson is a [id number] at [redacted]w[redacted]d who had a spontaneous delivery at 2154 a viable female ([name]) was delivered via  ROA.  APGAR: 8, 9; weight 4lb 7.3oz (2020g)  Induction of labor for severe preeclampsia. Induced with cytotec, inpatient foley bulb, pitocin, AROM. Received epidural for pain management. Progressed rapidly from 6 to complete.   I was called by RN and notified that "the baby is coming." Patient did not push. When I presented to room 1 minute later, Dr. Ernestina Penna at bedside and baby had been delivered. No nuchal cord. Cord was clamped and cut, baby handed to NICU team. I took over at this time.   Delivery of placenta was spontaneous. Placenta was found to be intact, 3 -vessel cord was noted. Perineum intact. Uterine atony appreciated one exam. TXA and pitocin given through IV. Misoprostol administered per rectum.  Approx of blood clot expressed with bimanual exam with good response to exam and uterotonics. Instrument and gauze counts were correct at the end of the procedure.   Placenta status: to pathology Anesthesia:  epidural Episiotomy:  none Lacerations:  none Suture Repair: n/a Est. Blood Loss (mL):  on triton  Mom to postpartum.  Baby to NICU.  Charlett Nose 09/23/2022, 11:15 PM

## 2022-04-05 LAB — HEPATITIS C ANTIBODY: HCV Ab: NEGATIVE

## 2022-04-05 LAB — OB RESULTS CONSOLE VARICELLA ZOSTER ANTIBODY, IGG: Varicella: IMMUNE

## 2022-04-05 LAB — OB RESULTS CONSOLE GC/CHLAMYDIA
Chlamydia: NEGATIVE
Neisseria Gonorrhea: NEGATIVE

## 2022-04-05 LAB — OB RESULTS CONSOLE HIV ANTIBODY (ROUTINE TESTING): HIV: NONREACTIVE

## 2022-04-05 LAB — OB RESULTS CONSOLE RUBELLA ANTIBODY, IGM: Rubella: IMMUNE

## 2022-04-05 LAB — OB RESULTS CONSOLE RPR: RPR: NONREACTIVE

## 2022-04-05 LAB — OB RESULTS CONSOLE HEPATITIS B SURFACE ANTIGEN: Hepatitis B Surface Ag: NEGATIVE

## 2022-04-09 ENCOUNTER — Inpatient Hospital Stay (HOSPITAL_COMMUNITY)
Admission: AD | Admit: 2022-04-09 | Discharge: 2022-04-09 | Disposition: A | Discharge status: Home / Self Care | Payer: Medicaid Other | Attending: Obstetrics and Gynecology | Admitting: Obstetrics and Gynecology

## 2022-04-09 ENCOUNTER — Encounter (HOSPITAL_COMMUNITY): Payer: Self-pay | Admitting: *Deleted

## 2022-04-09 DIAGNOSIS — O219 Vomiting of pregnancy, unspecified: Secondary | ICD-10-CM | POA: Insufficient documentation

## 2022-04-09 DIAGNOSIS — Z3A10 10 weeks gestation of pregnancy: Secondary | ICD-10-CM | POA: Diagnosis not present

## 2022-04-09 DIAGNOSIS — R519 Headache, unspecified: Secondary | ICD-10-CM | POA: Diagnosis not present

## 2022-04-09 DIAGNOSIS — O26891 Other specified pregnancy related conditions, first trimester: Secondary | ICD-10-CM | POA: Diagnosis present

## 2022-04-09 LAB — URINALYSIS, ROUTINE W REFLEX MICROSCOPIC
Bilirubin Urine: NEGATIVE
Glucose, UA: NEGATIVE mg/dL
Hgb urine dipstick: NEGATIVE
Ketones, ur: 20 mg/dL — AB
Leukocytes,Ua: NEGATIVE
Nitrite: NEGATIVE
Protein, ur: NEGATIVE mg/dL
Specific Gravity, Urine: 1.027 (ref 1.005–1.030)
pH: 5 (ref 5.0–8.0)

## 2022-04-09 MED ORDER — ACETAMINOPHEN-CAFFEINE 500-65 MG PO TABS
1.0000 | ORAL_TABLET | Freq: Once | ORAL | Status: AC
  Administered 2022-04-09: 1 via ORAL
  Filled 2022-04-09: qty 1

## 2022-04-09 MED ORDER — ONDANSETRON 4 MG PO TBDP
4.0000 mg | ORAL_TABLET | Freq: Once | ORAL | Status: AC
Start: 2022-04-09 — End: 2022-04-09
  Administered 2022-04-09: 4 mg via ORAL
  Filled 2022-04-09: qty 1

## 2022-04-09 MED ORDER — ONDANSETRON HCL 4 MG PO TABS: 4.0000 mg | ORAL_TABLET | Freq: Three times a day (TID) | ORAL | 0 refills | Status: DC | PRN

## 2022-04-09 NOTE — MAU Provider Note (Signed)
History     CSN: [id number]  Arrival date and time: 04/09/22 1127   Event Date/Time   First Provider Initiated Contact with Patient 04/09/22 1238      Chief Complaint  Patient presents with   Abdominal Pain   Headache   Emesis   Tina Dickson , a  27 y.o. G3P2002 at [redacted]w[redacted]d presents to MAU with complaints of a headache that has been on-going since last night. Patient reports she attempted to relive headache with PO Tylenol 500mg  last night without relief. Patient describes headache as "throbbing" and "across the front of the headache." Patient denies a history of migraines and headaches. She endorses being worsened by light and sound. She endorses some nausea and vomiting as well. She endorses 1 episode of vomiting today, but notes its more related to pain of headache. Diet recall today was a smoothie and half a cracker. She denies water intake today but notes 3 bottles of water yesterday. She has no other complaints.   FHT attempted in Triage but unsuccessful. Patient confirmed IUP with several in Dr. [place] office. Last being Friday of last week.           OB History     Gravida  3   Para  2   Term  2   Preterm  0   AB  0   Living  2      SAB  0   IAB  0   Ectopic  0   Multiple  0   Live Births  2        Obstetric Comments  G1 - reported increased bleeding requiring meds but no transfusion, per patient         Past Medical History:  Diagnosis Date   Abdominal pain, recurrent    characterized as crampy, spasmodic pain   Asthma    as a child- not since 2010   Chlamydia 08/2020   during pregnancy   Chronic constipation    Depression    Eczema     Past Surgical History:  Procedure Laterality Date   DILATION AND CURETTAGE OF UTERUS  04/2021   HERNIA REPAIR  1998    Family History  Problem Relation Age of Onset   Lactose intolerance Mother    Asthma Mother    Irritable bowel syndrome Father    Diabetes Father    Asthma Brother     Cancer Maternal Grandmother    Diabetes Paternal Grandfather     Social History   Tobacco Use   Smoking status: Never   Smokeless tobacco: Never  Vaping Use   Vaping Use: Never used  Substance Use Topics   Alcohol use: No   Drug use: No    Allergies:  Allergies  Allergen Reactions   Latex Swelling   Penicillins Other (See Comments)    Gets severe yeast infections with cillins    Medications Prior to Admission  Medication Sig Dispense Refill Last Dose   acetaminophen (TYLENOL) 500 MG tablet Take 1,000 mg by mouth every 6 (six) hours as needed.   04/08/2022   ondansetron (ZOFRAN-ODT) 4 MG disintegrating tablet Take 1 tablet (4 mg total) by mouth every 6 (six) hours as needed for nausea. 20 tablet 0 Past Month   Prenatal Vit-Fe Fumarate-FA (PRENATAL MULTIVITAMIN) TABS tablet Take 1 tablet by mouth daily at 12 noon.   04/08/2022   promethazine (PHENERGAN) 25 MG tablet Take 25 mg by mouth every 6 (six) hours as needed  for nausea or vomiting.   04/08/2022   ferrous sulfate 325 (65 FE) MG tablet Take 1 tablet (325 mg total) by mouth daily with breakfast. 30 tablet 3 Unknown   ibuprofen (ADVIL) 600 MG tablet Take 1 tablet (600 mg total) by mouth every 6 (six) hours as needed for moderate pain or cramping. 40 tablet 1 Unknown    Review of Systems  Constitutional:  Positive for appetite change. Negative for chills, fatigue and fever.  Eyes:  Negative for pain and visual disturbance.  Respiratory:  Negative for apnea, shortness of breath and wheezing.   Cardiovascular:  Negative for chest pain and palpitations.  Gastrointestinal:  Negative for abdominal pain, constipation, diarrhea, nausea and vomiting.  Genitourinary:  Negative for difficulty urinating, dysuria, pelvic pain, vaginal bleeding, vaginal discharge and vaginal pain.  Musculoskeletal:  Negative for back pain.  Neurological:  Positive for headaches. Negative for dizziness, seizures, facial asymmetry, weakness, light-headedness and  numbness.  Psychiatric/Behavioral:  Negative for suicidal ideas.    Physical Exam   Blood pressure (!) 156/80, pulse (!) 106, temperature 99 F (37.2 C), temperature source Oral, resp. rate 18, height 5\' 3"  (1.6 m), weight 99.1 kg, SpO2 100 %, unknown if currently breastfeeding.  Physical Exam Vitals and nursing note reviewed.  Constitutional:      General: She is not in acute distress.    Appearance: Normal appearance.  HENT:     Head: Normocephalic.  Pulmonary:     Effort: Pulmonary effort is normal.  Abdominal:     Palpations: Abdomen is soft.     Tenderness: There is no abdominal tenderness.  Musculoskeletal:        General: Normal range of motion.     Cervical back: Normal range of motion.  Skin:    General: Skin is warm and dry.  Neurological:     Mental Status: She is alert and oriented to person, place, and time.     GCS: GCS eye subscore is 4. GCS verbal subscore is 5. GCS motor subscore is 6.     Deep Tendon Reflexes: Reflexes normal.  Psychiatric:        Mood and Affect: Mood normal.     FHR: 130 bpm via Bedside doppler.   Patient informed that the ultrasound is considered a limited OB ultrasound and is not intended to be a complete ultrasound exam.  Patient also informed that the ultrasound is not being completed with the intent of assessing for fetal or placental anomalies or any pelvic abnormalities.  Explained that the purpose of today's ultrasound is to assess for  heart tones .  Patient acknowledges the purpose of the exam and the limitations of the study.  [place], CNM  04/09/2022 1:03 PM   MAU Course  Procedures Orders Placed This Encounter  Procedures   Urinalysis, Routine w reflex microscopic Urine, Clean Catch   Meds ordered this encounter  Medications   acetaminophen-caffeine (EXCEDRIN TENSION HEADACHE) 500-65 MG per tablet 1 tablet   Results for orders placed or performed during the hospital encounter of 04/09/22 (from the past 24  hour(s))  Urinalysis, Routine w reflex microscopic Urine, Clean Catch     Status: Abnormal   Collection Time: 04/09/22 11:53 AM  Result Value Ref Range   Color, Urine YELLOW YELLOW   APPearance HAZY (A) CLEAR   Specific Gravity, Urine 1.027 1.005 - 1.030   pH 5.0 5.0 - 8.0   Glucose, UA NEGATIVE NEGATIVE mg/dL   Hgb urine dipstick  NEGATIVE NEGATIVE   Bilirubin Urine NEGATIVE NEGATIVE   Ketones, ur 20 (A) NEGATIVE mg/dL   Protein, ur NEGATIVE NEGATIVE mg/dL   Nitrite NEGATIVE NEGATIVE   Leukocytes,Ua NEGATIVE NEGATIVE     MDM - UA hazy with 20 of Ketones otherwise normal. Low suspicion for Dehydration.  - Patient called out feeling nauseated. ODT Zofran ordered.  - Headache improved with PO Excedrin from 8-->2/10.  - Patient denies nausea following ODT Zofran.  - Plan for discharge.  Assessment and Plan   1. Pregnancy headache in first trimester   2. Nausea and vomiting in pregnancy   3. [redacted] weeks gestation of pregnancy    - Reviewed that nausea and headaches can be a normal discomfort of pregnancy.  - Rx for Zofran sent to outpatient pharmacy. - Reviewed other comfort measures aside from pharmacology for nausea and headaches.  - Recommended PO Excedrin for headaches.  - Worsening signs and return precautions reviewed.  - Patient discharged home in stable condition and may return to MAU as needed.   Jacquiline Doe, MSN CNM  04/09/2022, 12:38 PM

## 2022-04-09 NOTE — MAU Note (Signed)
Tina Dickson is a 27 y.o. at [redacted]w[redacted]d here in MAU reporting: has a really bad HA, had it since last night.  Took Tylenol 500mg  , didin't really settle it.  Threw up last night, noted blood in it.  Got sick this morning on the way to church, noted blood in it again. Having pain in LUQ.  Onset of complaint: last side Pain score: HA 8; abd 5 Vitals:   04/09/22 1142  BP: (!) 145/91  Pulse: 79  Resp: 18  Temp: 99 F (37.2 C)  SpO2: 100%     Lab orders placed from triage:  urine Has been seen in office for preg
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
## 2022-09-21 ENCOUNTER — Encounter (HOSPITAL_COMMUNITY): Payer: Self-pay | Admitting: Obstetrics and Gynecology

## 2022-09-21 ENCOUNTER — Inpatient Hospital Stay (HOSPITAL_COMMUNITY)
Admission: AD | Admit: 2022-09-21 | Discharge: 2022-09-26 | DRG: 806 | Disposition: A | Discharge status: Home / Self Care | Payer: Medicaid Other | Attending: Obstetrics and Gynecology | Admitting: Obstetrics and Gynecology

## 2022-09-21 DIAGNOSIS — O1414 Severe pre-eclampsia complicating childbirth: Principal | ICD-10-CM | POA: Diagnosis present

## 2022-09-21 DIAGNOSIS — O1490 Unspecified pre-eclampsia, unspecified trimester: Secondary | ICD-10-CM | POA: Diagnosis present

## 2022-09-21 DIAGNOSIS — O133 Gestational [pregnancy-induced] hypertension without significant proteinuria, third trimester: Secondary | ICD-10-CM

## 2022-09-21 DIAGNOSIS — O4703 False labor before 37 completed weeks of gestation, third trimester: Secondary | ICD-10-CM

## 2022-09-21 DIAGNOSIS — O1493 Unspecified pre-eclampsia, third trimester: Secondary | ICD-10-CM

## 2022-09-21 DIAGNOSIS — O9081 Anemia of the puerperium: Secondary | ICD-10-CM | POA: Diagnosis not present

## 2022-09-21 DIAGNOSIS — O1413 Severe pre-eclampsia, third trimester: Secondary | ICD-10-CM | POA: Diagnosis present

## 2022-09-21 DIAGNOSIS — O99284 Endocrine, nutritional and metabolic diseases complicating childbirth: Secondary | ICD-10-CM | POA: Diagnosis present

## 2022-09-21 DIAGNOSIS — Z3A34 34 weeks gestation of pregnancy: Secondary | ICD-10-CM

## 2022-09-21 DIAGNOSIS — Z3A33 33 weeks gestation of pregnancy: Secondary | ICD-10-CM

## 2022-09-21 DIAGNOSIS — O26893 Other specified pregnancy related conditions, third trimester: Secondary | ICD-10-CM

## 2022-09-21 DIAGNOSIS — O99214 Obesity complicating childbirth: Secondary | ICD-10-CM | POA: Diagnosis present

## 2022-09-21 DIAGNOSIS — D62 Acute posthemorrhagic anemia: Secondary | ICD-10-CM | POA: Diagnosis not present

## 2022-09-21 DIAGNOSIS — R519 Headache, unspecified: Secondary | ICD-10-CM

## 2022-09-21 LAB — URINALYSIS, ROUTINE W REFLEX MICROSCOPIC
Glucose, UA: NEGATIVE mg/dL
Hgb urine dipstick: NEGATIVE
Ketones, ur: 20 mg/dL — AB
Leukocytes,Ua: NEGATIVE
Nitrite: NEGATIVE
Protein, ur: 30 mg/dL — AB
Specific Gravity, Urine: 1.028 (ref 1.005–1.030)
pH: 5 (ref 5.0–8.0)

## 2022-09-21 LAB — CBC
HCT: 34.3 % — ABNORMAL LOW (ref 36.0–46.0)
Hemoglobin: 10.7 g/dL — ABNORMAL LOW (ref 12.0–15.0)
MCH: 23.6 pg — ABNORMAL LOW (ref 26.0–34.0)
MCHC: 31.2 g/dL (ref 30.0–36.0)
MCV: 75.6 fL — ABNORMAL LOW (ref 80.0–100.0)
Platelets: 190 10*3/uL (ref 150–400)
RBC: 4.54 MIL/uL (ref 3.87–5.11)
RDW: 15.2 % (ref 11.5–15.5)
WBC: 7.8 10*3/uL (ref 4.0–10.5)
nRBC: 0 % (ref 0.0–0.2)

## 2022-09-21 LAB — COMPREHENSIVE METABOLIC PANEL
ALT: 20 U/L (ref 0–44)
AST: 17 U/L (ref 15–41)
Albumin: 2.9 g/dL — ABNORMAL LOW (ref 3.5–5.0)
Alkaline Phosphatase: 108 U/L (ref 38–126)
Anion gap: 10 (ref 5–15)
BUN: <5 mg/dL — ABNORMAL LOW (ref 6–20)
CO2: 20 mmol/L — ABNORMAL LOW (ref 22–32)
Calcium: 8.7 mg/dL — ABNORMAL LOW (ref 8.9–10.3)
Chloride: 106 mmol/L (ref 98–111)
Creatinine, Ser: 0.66 mg/dL (ref 0.44–1.00)
GFR, Estimated: >60 mL/min (ref >60)
Glucose, Bld: 90 mg/dL (ref 70–99)
Potassium: 3.6 mmol/L (ref 3.5–5.1)
Sodium: 136 mmol/L (ref 135–145)
Total Bilirubin: 0.3 mg/dL (ref 0.3–1.2)
Total Protein: 7.1 g/dL (ref 6.5–8.1)

## 2022-09-21 LAB — WET PREP, GENITAL
Clue Cells Wet Prep HPF POC: NONE SEEN
Sperm: NONE SEEN
Trich, Wet Prep: NONE SEEN
WBC, Wet Prep HPF POC: <10 (ref <10)
Yeast Wet Prep HPF POC: NONE SEEN

## 2022-09-21 LAB — PROTEIN / CREATININE RATIO, URINE
Creatinine, Urine: 277 mg/dL
Protein Creatinine Ratio: 0.08 mg/mg{Cre} (ref 0.00–0.15)
Total Protein, Urine: 23 mg/dL

## 2022-09-21 MED ORDER — METOCLOPRAMIDE HCL 5 MG/ML IJ SOLN
10.0000 mg | Freq: Once | INTRAMUSCULAR | Status: AC
  Administered 2022-09-21: 10 mg via INTRAVENOUS
  Filled 2022-09-21: qty 2

## 2022-09-21 MED ORDER — PRENATAL MULTIVITAMIN CH: 1.0000 | ORAL_TABLET | Freq: Every day | ORAL | Status: DC

## 2022-09-21 MED ORDER — LACTATED RINGERS IV BOLUS
500.0000 mL | Freq: Once | INTRAVENOUS | Status: AC
  Administered 2022-09-21: 500 mL via INTRAVENOUS

## 2022-09-21 MED ORDER — DIPHENHYDRAMINE HCL 50 MG/ML IJ SOLN
25.0000 mg | Freq: Once | INTRAMUSCULAR | Status: AC
  Administered 2022-09-21: 25 mg via INTRAVENOUS
  Filled 2022-09-21: qty 1

## 2022-09-21 MED ORDER — OXYCODONE HCL 5 MG PO TABS
5.0000 mg | ORAL_TABLET | Freq: Once | ORAL | Status: AC
  Administered 2022-09-21: 5 mg via ORAL
  Filled 2022-09-21: qty 1

## 2022-09-21 MED ORDER — ACETAMINOPHEN 325 MG PO TABS
650.0000 mg | ORAL_TABLET | ORAL | Status: DC | PRN
  Administered 2022-09-22: 650 mg via ORAL
  Filled 2022-09-21: qty 2

## 2022-09-21 MED ORDER — OXYCODONE HCL 5 MG PO TABS
5.0000 mg | ORAL_TABLET | Freq: Once | ORAL | Status: AC
  Administered 2022-09-22: 5 mg via ORAL
  Filled 2022-09-21: qty 1

## 2022-09-21 MED ORDER — TERBUTALINE SULFATE 1 MG/ML IJ SOLN
0.2500 mg | Freq: Once | INTRAMUSCULAR | Status: AC
  Administered 2022-09-21: 0.25 mg via SUBCUTANEOUS
  Filled 2022-09-21: qty 1

## 2022-09-21 MED ORDER — NIFEDIPINE ER OSMOTIC RELEASE 30 MG PO TB24
30.0000 mg | ORAL_TABLET | Freq: Every day | ORAL | Status: DC
  Administered 2022-09-22 – 2022-09-23 (×2): 30 mg via ORAL
  Filled 2022-09-21 (×2): qty 1

## 2022-09-21 MED ORDER — DOCUSATE SODIUM 100 MG PO CAPS
100.0000 mg | ORAL_CAPSULE | Freq: Every day | ORAL | Status: DC
  Administered 2022-09-22: 100 mg via ORAL
  Filled 2022-09-21: qty 1

## 2022-09-21 MED ORDER — NIFEDIPINE ER OSMOTIC RELEASE 30 MG PO TB24
30.0000 mg | ORAL_TABLET | Freq: Once | ORAL | Status: DC
  Filled 2022-09-21: qty 1

## 2022-09-21 MED ORDER — NIFEDIPINE 10 MG PO CAPS
10.0000 mg | ORAL_CAPSULE | ORAL | Status: DC | PRN
  Administered 2022-09-21 (×2): 10 mg via ORAL
  Filled 2022-09-21 (×3): qty 1

## 2022-09-21 MED ORDER — ONDANSETRON HCL 4 MG/2ML IJ SOLN
4.0000 mg | Freq: Once | INTRAMUSCULAR | Status: AC
  Administered 2022-09-21: 4 mg via INTRAVENOUS
  Filled 2022-09-21: qty 2

## 2022-09-21 MED ORDER — ACETAMINOPHEN-CAFFEINE 500-65 MG PO TABS
2.0000 | ORAL_TABLET | Freq: Once | ORAL | Status: AC
Start: 2022-09-21 — End: 2022-09-21
  Administered 2022-09-21: 2 via ORAL
  Filled 2022-09-21: qty 2

## 2022-09-21 MED ORDER — LACTATED RINGERS IV SOLN: INTRAVENOUS | Status: DC

## 2022-09-21 MED ORDER — CALCIUM CARBONATE ANTACID 500 MG PO CHEW: 2.0000 | CHEWABLE_TABLET | ORAL | Status: DC | PRN

## 2022-09-21 NOTE — MAU Note (Signed)
.  Aylyn BERNINA EDMISTER is a 27 y.o. at [redacted]w[redacted]d here in MAU reporting: CTX since 1300 that are now every 4-5 minutes. She reports initially her CTX's were not painful but have recently become painful. Denies VB or LOF. Denies vaginal discharge and vaginal odors. DFM since her CTX's started. Last felt fetal movement when she was on her way to MAU.   Onset of complaint: 1300 Pain score: 4/10 lower abdomen  Vitals:   09/21/22 1851  BP: (!) 151/92  Pulse: (!) 105  Resp: 18  Temp: 98.7 F (37.1 C)  SpO2: 99%     FHT: 170 initial external Lab orders placed from triage:  UA

## 2022-09-21 NOTE — MAU Provider Note (Addendum)
History     CSN: [id number]  Arrival date and time: 09/21/22 1827   None     Chief Complaint  Patient presents with   Contractions   HPI Patient presenting for evaluation after she started having contractions at approximately 1 PM today.  Contractions are now every 45 minutes and are extremely painful.  Denies any vaginal bleeding or gushing of fluid.  Denies any recent sexual intercourse.  Reports that she had good fetal movement until she started having these contractions and since then she has not felt the baby move much.  Denies any recent illnesses.  Denies any issues with hypertension in this pregnancy that she knows of but does note she has seen pregnancy-induced hypertension in her chart.  Had issues with elevated blood pressures in her last pregnancy but that was after delivery.  Also reports postpartum hemorrhage.  Denies any changes in vision, right upper quadrant pain, lower extremity swelling but does report a headache which has been going on most of the day.  OB History     Gravida  3   Para  2   Term  2   Preterm  0   AB  0   Living  2      SAB  0   IAB  0   Ectopic  0   Multiple  0   Live Births  2        Obstetric Comments  G1 - reported increased bleeding requiring meds but no transfusion, per patient         Past Medical History:  Diagnosis Date   Abdominal pain, recurrent    characterized as crampy, spasmodic pain   Asthma    as a child- not since 2010   Chlamydia 08/2020   during pregnancy   Chronic constipation    Depression    Eczema     Past Surgical History:  Procedure Laterality Date   DILATION AND CURETTAGE OF UTERUS  04/2021   HERNIA REPAIR  1998    Family History  Problem Relation Age of Onset   Lactose intolerance Mother    Asthma Mother    Irritable bowel syndrome Father    Diabetes Father    Asthma Brother    Cancer Maternal Grandmother    Diabetes Paternal Grandfather     Social History   Tobacco Use    Smoking status: Never   Smokeless tobacco: Never  Vaping Use   Vaping Use: Never used  Substance Use Topics   Alcohol use: No   Drug use: No    Allergies:  Allergies  Allergen Reactions   Latex Swelling   Penicillins Other (See Comments)    Gets severe yeast infections with cillins    Medications Prior to Admission  Medication Sig Dispense Refill Last Dose   ondansetron (ZOFRAN) 4 MG tablet Take 1 tablet (4 mg total) by mouth every 8 (eight) hours as needed for nausea or vomiting. 20 tablet 0 09/21/2022 at 1000   acetaminophen (TYLENOL) 500 MG tablet Take 1,000 mg by mouth every 6 (six) hours as needed.      ferrous sulfate 325 (65 FE) MG tablet Take 1 tablet (325 mg total) by mouth daily with breakfast. 30 tablet 3    ibuprofen (ADVIL) 600 MG tablet Take 1 tablet (600 mg total) by mouth every 6 (six) hours as needed for moderate pain or cramping. 40 tablet 1    ondansetron (ZOFRAN-ODT) 4 MG disintegrating tablet Take 1 tablet (4  mg total) by mouth every 6 (six) hours as needed for nausea. 20 tablet 0    Prenatal Vit-Fe Fumarate-FA (PRENATAL MULTIVITAMIN) TABS tablet Take 1 tablet by mouth daily at 12 noon.      promethazine (PHENERGAN) 25 MG tablet Take 25 mg by mouth every 6 (six) hours as needed for nausea or vomiting.       Review of Systems  Constitutional:  Positive for fatigue. Negative for fever.  HENT:  Negative for congestion and rhinorrhea.   Eyes:  Negative for visual disturbance.  Respiratory:  Negative for shortness of breath.   Cardiovascular:  Negative for chest pain.  Gastrointestinal:  Positive for abdominal pain, nausea and vomiting. Negative for constipation and diarrhea.  Genitourinary:  Positive for pelvic pain and vaginal discharge. Negative for vaginal bleeding and vaginal pain.  Neurological:  Positive for weakness and headaches.   Physical Exam   Blood pressure (!) 141/94, pulse (!) 104, temperature 98.7 F (37.1 C), temperature source Oral, resp.  rate 18, height 5\' 3"  (1.6 m), weight 103.9 kg, SpO2 99 %, unknown if currently breastfeeding.  Physical Exam Vitals and nursing note reviewed. Exam conducted with a chaperone present.  Constitutional:      Appearance: She is ill-appearing.  HENT:     Head: Normocephalic and atraumatic.     Nose: No congestion or rhinorrhea.  Cardiovascular:     Rate and Rhythm: Normal rate.  Pulmonary:     Effort: Pulmonary effort is normal.  Abdominal:     Tenderness: There is no abdominal tenderness.     Comments: Gravid  Skin:    General: Skin is warm.     Capillary Refill: Capillary refill takes less than 2 seconds.  Neurological:     General: No focal deficit present.     Mental Status: She is alert.  Psychiatric:        Mood and Affect: Mood normal.     MAU Course  Procedures  MDM CBC CMP PC ratio Wet prep GC/chlamydia Fluid bolus Zofran Excedrin tension   Assessment and Plan  Maurya KATLYNN PENUELAS is a 27 yo presenting for contractions and noted to have elevated blood pressure.  Preterm labor Unclear etiology for preterm labor.  Wet prep and GC/committee collected.  Contracting every 3 to 5 minutes on the monitor.  Fluid bolus given, awaiting response.  Signed out to oncoming provider who will continue to monitor and treat as needed.  Elevated blood pressure Patient with elevated blood pressure on arrival.  All mild range elevations.  CBC, CMP, protein creatinine ratio collected.  Patient also had a headache.  Patient given Excedrin tension headache.  Results will be followed up and treated as needed.  This also handed off to oncoming provider.  Celedonio Savage 09/21/2022, 8:00 PM   2040:   Headache unrelieved by Excedrin Tension              Vitals:   09/21/22 2030 09/21/22 2036  BP: (!) 154/112 (!) 148/94  Pulse: 93 (!) 121  Resp:    Temp:    SpO2:    Uterine contractions continue Will order Reglan and Benadryl for headache Will start Procardia series for  contractions  Got two doses of Procardia BP went down  Contractions remain frequent but are less severe Vitals:   09/21/22 2046 09/21/22 2100 09/21/22 2132 09/21/22 2148  BP: 126/69 122/68 (!) 125/59 (!) 131/56  Pulse: (!) 103 96 (!) 124 (!) 111  Resp:  Temp:      TempSrc:      SpO2:    99%  Weight:      Height:       PIH labs are normal Results for orders placed or performed during the hospital encounter of 09/21/22 (from the past 24 hour(s))  Urinalysis, Routine w reflex microscopic -Urine, Clean Catch     Status: Abnormal   Collection Time: 09/21/22  6:46 PM  Result Value Ref Range   Color, Urine YELLOW YELLOW   APPearance CLOUDY (A) CLEAR   Specific Gravity, Urine 1.028 1.005 - 1.030   pH 5.0 5.0 - 8.0   Glucose, UA NEGATIVE NEGATIVE mg/dL   Hgb urine dipstick NEGATIVE NEGATIVE   Bilirubin Urine SMALL (A) NEGATIVE   Ketones, ur 20 (A) NEGATIVE mg/dL   Protein, ur 30 (A) NEGATIVE mg/dL   Nitrite NEGATIVE NEGATIVE   Leukocytes,Ua NEGATIVE NEGATIVE   RBC / HPF 0-5 0 - 5 RBC/hpf   WBC, UA 0-5 0 - 5 WBC/hpf   Bacteria, UA RARE (A) NONE SEEN   Squamous Epithelial / HPF 21-50 0 - 5 /HPF   Mucus PRESENT   Protein / creatinine ratio, urine     Status: None   Collection Time: 09/21/22  6:46 PM  Result Value Ref Range   Creatinine, Urine 277 mg/dL   Total Protein, Urine 23 mg/dL   Protein Creatinine Ratio 0.08 0.00 - 0.15 mg/mg[Cre]  CBC     Status: Abnormal   Collection Time: 09/21/22  7:55 PM  Result Value Ref Range   WBC 7.8 4.0 - 10.5 K/uL   RBC 4.54 3.87 - 5.11 MIL/uL   Hemoglobin 10.7 (L) 12.0 - 15.0 g/dL   HCT 11.9 (L) 14.7 - 82.9 %   MCV 75.6 (L) 80.0 - 100.0 fL   MCH 23.6 (L) 26.0 - 34.0 pg   MCHC 31.2 30.0 - 36.0 g/dL   RDW 56.2 13.0 - 86.5 %   Platelets 190 150 - 400 K/uL   nRBC 0.0 0.0 - 0.2 %  Comprehensive metabolic panel     Status: Abnormal   Collection Time: 09/21/22  7:55 PM  Result Value Ref Range   Sodium 136 135 - 145 mmol/L   Potassium  3.6 3.5 - 5.1 mmol/L   Chloride 106 98 - 111 mmol/L   CO2 20 (L) 22 - 32 mmol/L   Glucose, Bld 90 70 - 99 mg/dL   BUN <5 (L) 6 - 20 mg/dL   Creatinine, Ser 7.84 0.44 - 1.00 mg/dL   Calcium 8.7 (L) 8.9 - 10.3 mg/dL   Total Protein 7.1 6.5 - 8.1 g/dL   Albumin 2.9 (L) 3.5 - 5.0 g/dL   AST 17 15 - 41 U/L   ALT 20 0 - 44 U/L   Alkaline Phosphatase 108 38 - 126 U/L   Total Bilirubin 0.3 0.3 - 1.2 mg/dL   GFR, Estimated >69 >62 mL/min   Anion gap 10 5 - 15  Wet prep, genital     Status: None   Collection Time: 09/21/22  7:55 PM  Result Value Ref Range   Yeast Wet Prep HPF POC NONE SEEN NONE SEEN   Trich, Wet Prep NONE SEEN NONE SEEN   Clue Cells Wet Prep HPF POC NONE SEEN NONE SEEN   WBC, Wet Prep HPF POC <10 <10   Sperm NONE SEEN     2145: Headache did not improve after Reglan/Benadryl and now is a bit worse after Procardia (unclear  if related to that) Spoke with Dr Mindi Slicker, who recommends Terbutaline and observation for an hour 2230:  States still feels contractions the same EFM shows uterine irritability now with occasional contractions Headache not better yet 2255: Headache not improved Patient states contractions remain the same Message left with Dr Mindi Slicker  2345: Dr Mindi Slicker called back  Will admit for observation for now  Will give a dose of Procardia XL for now  A:  Single IUP at [redacted]w[redacted]d       Headache in pregnancy        Gestational hypertension vs Preeclampsia with severe features       Preterm uterine contractions  P:  Admit to [facility]      Routine orders       Procardia XL 30mg        Possible Magnesium Sulfate if BP does not improve       MD to follow  Aviva Signs, CNM

## 2022-09-22 ENCOUNTER — Ambulatory Visit (HOSPITAL_COMMUNITY): Payer: Medicaid Other

## 2022-09-22 ENCOUNTER — Inpatient Hospital Stay (HOSPITAL_COMMUNITY): Payer: Medicaid Other | Admitting: Anesthesiology

## 2022-09-22 ENCOUNTER — Observation Stay (HOSPITAL_COMMUNITY): Payer: Medicaid Other

## 2022-09-22 ENCOUNTER — Other Ambulatory Visit: Payer: Self-pay

## 2022-09-22 DIAGNOSIS — I498 Other specified cardiac arrhythmias: Secondary | ICD-10-CM | POA: Diagnosis not present

## 2022-09-22 DIAGNOSIS — O99214 Obesity complicating childbirth: Secondary | ICD-10-CM | POA: Diagnosis present

## 2022-09-22 DIAGNOSIS — D62 Acute posthemorrhagic anemia: Secondary | ICD-10-CM | POA: Diagnosis not present

## 2022-09-22 DIAGNOSIS — Z3A34 34 weeks gestation of pregnancy: Secondary | ICD-10-CM | POA: Diagnosis not present

## 2022-09-22 DIAGNOSIS — I472 Ventricular tachycardia, unspecified: Secondary | ICD-10-CM | POA: Diagnosis not present

## 2022-09-22 DIAGNOSIS — O4703 False labor before 37 completed weeks of gestation, third trimester: Secondary | ICD-10-CM | POA: Diagnosis not present

## 2022-09-22 DIAGNOSIS — O1493 Unspecified pre-eclampsia, third trimester: Secondary | ICD-10-CM | POA: Diagnosis not present

## 2022-09-22 DIAGNOSIS — R519 Headache, unspecified: Secondary | ICD-10-CM | POA: Diagnosis not present

## 2022-09-22 DIAGNOSIS — O26893 Other specified pregnancy related conditions, third trimester: Secondary | ICD-10-CM | POA: Diagnosis not present

## 2022-09-22 DIAGNOSIS — O1413 Severe pre-eclampsia, third trimester: Secondary | ICD-10-CM | POA: Diagnosis present

## 2022-09-22 DIAGNOSIS — O133 Gestational [pregnancy-induced] hypertension without significant proteinuria, third trimester: Secondary | ICD-10-CM | POA: Diagnosis not present

## 2022-09-22 DIAGNOSIS — O1414 Severe pre-eclampsia complicating childbirth: Secondary | ICD-10-CM | POA: Diagnosis present

## 2022-09-22 DIAGNOSIS — Z3A33 33 weeks gestation of pregnancy: Secondary | ICD-10-CM | POA: Diagnosis not present

## 2022-09-22 DIAGNOSIS — O99284 Endocrine, nutritional and metabolic diseases complicating childbirth: Secondary | ICD-10-CM | POA: Diagnosis present

## 2022-09-22 DIAGNOSIS — O9081 Anemia of the puerperium: Secondary | ICD-10-CM | POA: Diagnosis not present

## 2022-09-22 LAB — COMPREHENSIVE METABOLIC PANEL
ALT: 18 U/L (ref 0–44)
ALT: 19 U/L (ref 0–44)
AST: 13 U/L — ABNORMAL LOW (ref 15–41)
AST: 18 U/L (ref 15–41)
Albumin: 2.5 g/dL — ABNORMAL LOW (ref 3.5–5.0)
Albumin: 2.6 g/dL — ABNORMAL LOW (ref 3.5–5.0)
Alkaline Phosphatase: 88 U/L (ref 38–126)
Alkaline Phosphatase: 101 U/L (ref 38–126)
Anion gap: 8 (ref 5–15)
Anion gap: 8 (ref 5–15)
BUN: <5 mg/dL — ABNORMAL LOW (ref 6–20)
BUN: <5 mg/dL — ABNORMAL LOW (ref 6–20)
CO2: 19 mmol/L — ABNORMAL LOW (ref 22–32)
CO2: 18 mmol/L — ABNORMAL LOW (ref 22–32)
Calcium: 8.1 mg/dL — ABNORMAL LOW (ref 8.9–10.3)
Calcium: 7.4 mg/dL — ABNORMAL LOW (ref 8.9–10.3)
Chloride: 107 mmol/L (ref 98–111)
Chloride: 109 mmol/L (ref 98–111)
Creatinine, Ser: 0.66 mg/dL (ref 0.44–1.00)
Creatinine, Ser: 0.64 mg/dL (ref 0.44–1.00)
GFR, Estimated: >60 mL/min (ref >60)
GFR, Estimated: >60 mL/min (ref >60)
Glucose, Bld: 115 mg/dL — ABNORMAL HIGH (ref 70–99)
Glucose, Bld: 141 mg/dL — ABNORMAL HIGH (ref 70–99)
Potassium: 3.2 mmol/L — ABNORMAL LOW (ref 3.5–5.1)
Potassium: 4.1 mmol/L (ref 3.5–5.1)
Sodium: 134 mmol/L — ABNORMAL LOW (ref 135–145)
Sodium: 135 mmol/L (ref 135–145)
Total Bilirubin: 0.2 mg/dL — ABNORMAL LOW (ref 0.3–1.2)
Total Bilirubin: 0.2 mg/dL — ABNORMAL LOW (ref 0.3–1.2)
Total Protein: 5.9 g/dL — ABNORMAL LOW (ref 6.5–8.1)
Total Protein: 6.2 g/dL — ABNORMAL LOW (ref 6.5–8.1)

## 2022-09-22 LAB — CBC WITH DIFFERENTIAL/PLATELET
Abs Immature Granulocytes: 0.04 10*3/uL (ref 0.00–0.07)
Abs Immature Granulocytes: 0.06 10*3/uL (ref 0.00–0.07)
Abs Immature Granulocytes: 0.06 10*3/uL (ref 0.00–0.07)
Basophils Absolute: 0 10*3/uL (ref 0.0–0.1)
Basophils Absolute: 0 10*3/uL (ref 0.0–0.1)
Basophils Absolute: 0 10*3/uL (ref 0.0–0.1)
Basophils Relative: 1 %
Basophils Relative: 0 %
Basophils Relative: 0 %
Eosinophils Absolute: 0.1 10*3/uL (ref 0.0–0.5)
Eosinophils Absolute: 0 10*3/uL (ref 0.0–0.5)
Eosinophils Absolute: 0 10*3/uL (ref 0.0–0.5)
Eosinophils Relative: 1 %
Eosinophils Relative: 0 %
Eosinophils Relative: 0 %
HCT: 29.6 % — ABNORMAL LOW (ref 36.0–46.0)
HCT: 31 % — ABNORMAL LOW (ref 36.0–46.0)
HCT: 32.7 % — ABNORMAL LOW (ref 36.0–46.0)
Hemoglobin: 9.3 g/dL — ABNORMAL LOW (ref 12.0–15.0)
Hemoglobin: 9.7 g/dL — ABNORMAL LOW (ref 12.0–15.0)
Hemoglobin: 10.3 g/dL — ABNORMAL LOW (ref 12.0–15.0)
Immature Granulocytes: 1 %
Immature Granulocytes: 1 %
Immature Granulocytes: 1 %
Lymphocytes Relative: 28 %
Lymphocytes Relative: 13 %
Lymphocytes Relative: 16 %
Lymphs Abs: 2 10*3/uL (ref 0.7–4.0)
Lymphs Abs: 0.8 10*3/uL (ref 0.7–4.0)
Lymphs Abs: 1 10*3/uL (ref 0.7–4.0)
MCH: 23.5 pg — ABNORMAL LOW (ref 26.0–34.0)
MCH: 23.7 pg — ABNORMAL LOW (ref 26.0–34.0)
MCH: 24.3 pg — ABNORMAL LOW (ref 26.0–34.0)
MCHC: 31.4 g/dL (ref 30.0–36.0)
MCHC: 31.3 g/dL (ref 30.0–36.0)
MCHC: 31.5 g/dL (ref 30.0–36.0)
MCV: 74.7 fL — ABNORMAL LOW (ref 80.0–100.0)
MCV: 75.8 fL — ABNORMAL LOW (ref 80.0–100.0)
MCV: 77.3 fL — ABNORMAL LOW (ref 80.0–100.0)
Monocytes Absolute: 0.7 10*3/uL (ref 0.1–1.0)
Monocytes Absolute: 0.1 10*3/uL (ref 0.1–1.0)
Monocytes Absolute: 0.2 10*3/uL (ref 0.1–1.0)
Monocytes Relative: 10 %
Monocytes Relative: 2 %
Monocytes Relative: 3 %
Neutro Abs: 4.3 10*3/uL (ref 1.7–7.7)
Neutro Abs: 5.1 10*3/uL (ref 1.7–7.7)
Neutro Abs: 5.2 10*3/uL (ref 1.7–7.7)
Neutrophils Relative %: 59 %
Neutrophils Relative %: 84 %
Neutrophils Relative %: 80 %
Platelets: 175 10*3/uL (ref 150–400)
Platelets: 196 10*3/uL (ref 150–400)
Platelets: 201 10*3/uL (ref 150–400)
RBC: 3.96 MIL/uL (ref 3.87–5.11)
RBC: 4.09 MIL/uL (ref 3.87–5.11)
RBC: 4.23 MIL/uL (ref 3.87–5.11)
RDW: 15.2 % (ref 11.5–15.5)
RDW: 15.3 % (ref 11.5–15.5)
RDW: 15.4 % (ref 11.5–15.5)
WBC: 7.2 10*3/uL (ref 4.0–10.5)
WBC: 6 10*3/uL (ref 4.0–10.5)
WBC: 6.5 10*3/uL (ref 4.0–10.5)
nRBC: 0 % (ref 0.0–0.2)
nRBC: 0 % (ref 0.0–0.2)
nRBC: 0 % (ref 0.0–0.2)

## 2022-09-22 LAB — ECHOCARDIOGRAM COMPLETE
AR max vel: 2.34 cm2
AV Area VTI: 2.17 cm2
AV Area mean vel: 2.39 cm2
AV Mean grad: 6 mmHg
AV Peak grad: 12.4 mmHg
Ao pk vel: 1.76 m/s
Area-P 1/2: 5.62 cm2
Height: 63 in
S' Lateral: 2.7 cm
Weight: 3664 oz

## 2022-09-22 LAB — MAGNESIUM: Magnesium: 5.5 mg/dL — ABNORMAL HIGH (ref 1.7–2.4)

## 2022-09-22 LAB — TYPE AND SCREEN: ABO/RH(D): A POS

## 2022-09-22 LAB — URIC ACID: Uric Acid, Serum: 4 mg/dL (ref 2.5–7.1)

## 2022-09-22 LAB — GC/CHLAMYDIA PROBE AMP (~~LOC~~) NOT AT ARMC
Chlamydia: NEGATIVE
Comment: NEGATIVE
Comment: NORMAL
Neisseria Gonorrhea: NEGATIVE

## 2022-09-22 LAB — PREPARE RBC (CROSSMATCH)

## 2022-09-22 LAB — BRAIN NATRIURETIC PEPTIDE: B Natriuretic Peptide: 14.4 pg/mL (ref 0.0–100.0)

## 2022-09-22 LAB — BPAM RBC: Unit Type and Rh: 6200

## 2022-09-22 LAB — LACTATE DEHYDROGENASE: LDH: 122 U/L (ref 98–192)

## 2022-09-22 MED ORDER — PHENYLEPHRINE 80 MCG/ML (10ML) SYRINGE FOR IV PUSH (FOR BLOOD PRESSURE SUPPORT)
80.0000 ug | PREFILLED_SYRINGE | INTRAVENOUS | Status: DC | PRN
  Administered 2022-09-22: 80 ug via INTRAVENOUS

## 2022-09-22 MED ORDER — PHENYLEPHRINE 80 MCG/ML (10ML) SYRINGE FOR IV PUSH (FOR BLOOD PRESSURE SUPPORT)
80.0000 ug | PREFILLED_SYRINGE | INTRAVENOUS | Status: DC | PRN
  Administered 2022-09-22 (×2): 80 ug via INTRAVENOUS

## 2022-09-22 MED ORDER — FENTANYL CITRATE (PF) 100 MCG/2ML IJ SOLN
50.0000 ug | INTRAMUSCULAR | Status: DC | PRN
  Filled 2022-09-22: qty 2

## 2022-09-22 MED ORDER — OXYTOCIN-SODIUM CHLORIDE 30-0.9 UT/500ML-% IV SOLN
2.5000 [IU]/h | INTRAVENOUS | Status: DC
  Administered 2022-09-23: 2.5 [IU]/h via INTRAVENOUS
  Filled 2022-09-22 (×2): qty 500

## 2022-09-22 MED ORDER — ONDANSETRON HCL 4 MG/2ML IJ SOLN
4.0000 mg | Freq: Four times a day (QID) | INTRAMUSCULAR | Status: DC | PRN
  Administered 2022-09-22 – 2022-09-23 (×5): 4 mg via INTRAVENOUS
  Filled 2022-09-22 (×5): qty 2

## 2022-09-22 MED ORDER — ONDANSETRON 4 MG PO TBDP
4.0000 mg | ORAL_TABLET | Freq: Once | ORAL | Status: AC
  Administered 2022-09-22: 4 mg via ORAL
  Filled 2022-09-22: qty 1

## 2022-09-22 MED ORDER — CEFAZOLIN SODIUM-DEXTROSE 2-4 GM/100ML-% IV SOLN
2.0000 g | Freq: Once | INTRAVENOUS | Status: AC
  Administered 2022-09-22: 2 g via INTRAVENOUS
  Filled 2022-09-22: qty 100

## 2022-09-22 MED ORDER — LACTATED RINGERS IV SOLN: 500.0000 mL | Freq: Once | INTRAVENOUS | Status: DC

## 2022-09-22 MED ORDER — BETAMETHASONE SOD PHOS & ACET 6 (3-3) MG/ML IJ SUSP
12.0000 mg | INTRAMUSCULAR | Status: DC
  Administered 2022-09-22: 12 mg via INTRAMUSCULAR
  Filled 2022-09-22 (×2): qty 5

## 2022-09-22 MED ORDER — ACETAMINOPHEN 325 MG PO TABS: 650.0000 mg | ORAL_TABLET | ORAL | Status: DC | PRN

## 2022-09-22 MED ORDER — OXYCODONE-ACETAMINOPHEN 5-325 MG PO TABS: 2.0000 | ORAL_TABLET | ORAL | Status: DC | PRN

## 2022-09-22 MED ORDER — MAGNESIUM SULFATE 40 GM/1000ML IV SOLN
2.0000 g/h | INTRAVENOUS | Status: AC
  Administered 2022-09-22 – 2022-09-24 (×4): 2 g/h via INTRAVENOUS
  Filled 2022-09-22 (×4): qty 1000

## 2022-09-22 MED ORDER — IOHEXOL 350 MG/ML SOLN
75.0000 mL | Freq: Once | INTRAVENOUS | Status: AC | PRN
  Administered 2022-09-22: 75 mL via INTRAVENOUS

## 2022-09-22 MED ORDER — LABETALOL HCL 5 MG/ML IV SOLN: 40.0000 mg | INTRAVENOUS | Status: DC | PRN

## 2022-09-22 MED ORDER — OXYTOCIN BOLUS FROM INFUSION
333.0000 mL | Freq: Once | INTRAVENOUS | Status: AC
  Administered 2022-09-23: 333 mL via INTRAVENOUS

## 2022-09-22 MED ORDER — HYDRALAZINE HCL 20 MG/ML IJ SOLN: 10.0000 mg | INTRAMUSCULAR | Status: DC | PRN

## 2022-09-22 MED ORDER — EPHEDRINE 5 MG/ML INJ: 10.0000 mg | INTRAVENOUS | Status: DC | PRN

## 2022-09-22 MED ORDER — LIDOCAINE HCL (PF) 1 % IJ SOLN: 30.0000 mL | INTRAMUSCULAR | Status: DC | PRN

## 2022-09-22 MED ORDER — CEFAZOLIN SODIUM-DEXTROSE 1-4 GM/50ML-% IV SOLN
1.0000 g | Freq: Three times a day (TID) | INTRAVENOUS | Status: DC
  Administered 2022-09-23 (×3): 1 g via INTRAVENOUS
  Filled 2022-09-22 (×4): qty 50

## 2022-09-22 MED ORDER — TERBUTALINE SULFATE 1 MG/ML IJ SOLN: 0.2500 mg | Freq: Once | INTRAMUSCULAR | Status: DC | PRN

## 2022-09-22 MED ORDER — DIPHENHYDRAMINE HCL 50 MG/ML IJ SOLN
12.5000 mg | INTRAMUSCULAR | Status: DC | PRN
Start: 2022-09-22 — End: 2022-09-22

## 2022-09-22 MED ORDER — LABETALOL HCL 5 MG/ML IV SOLN: 20.0000 mg | INTRAVENOUS | Status: DC | PRN

## 2022-09-22 MED ORDER — FENTANYL-BUPIVACAINE-NACL 0.5-0.125-0.9 MG/250ML-% EP SOLN
12.0000 mL/h | EPIDURAL | Status: DC | PRN
  Administered 2022-09-22 – 2022-09-23 (×2): 12 mL/h via EPIDURAL
  Filled 2022-09-22 (×2): qty 250

## 2022-09-22 MED ORDER — EPHEDRINE 5 MG/ML INJ
10.0000 mg | INTRAVENOUS | Status: DC | PRN
  Administered 2022-09-22: 10 mg via INTRAVENOUS
  Filled 2022-09-22 (×2): qty 5

## 2022-09-22 MED ORDER — LABETALOL HCL 5 MG/ML IV SOLN: 80.0000 mg | INTRAVENOUS | Status: DC | PRN

## 2022-09-22 MED ORDER — EPHEDRINE 5 MG/ML INJ
10.0000 mg | INTRAVENOUS | Status: DC | PRN
Start: 2022-09-22 — End: 2022-09-22

## 2022-09-22 MED ORDER — SOD CITRATE-CITRIC ACID 500-334 MG/5ML PO SOLN
30.0000 mL | ORAL | Status: DC | PRN
  Administered 2022-09-23: 30 mL via ORAL
  Filled 2022-09-22: qty 30

## 2022-09-22 MED ORDER — SODIUM CHLORIDE 0.9% IV SOLUTION: Freq: Once | INTRAVENOUS | Status: DC

## 2022-09-22 MED ORDER — OXYCODONE-ACETAMINOPHEN 5-325 MG PO TABS: 1.0000 | ORAL_TABLET | ORAL | Status: DC | PRN

## 2022-09-22 MED ORDER — BUTALBITAL-APAP-CAFFEINE 50-325-40 MG PO TABS
2.0000 | ORAL_TABLET | Freq: Once | ORAL | Status: AC | PRN
  Administered 2022-09-22: 2 via ORAL
  Filled 2022-09-22: qty 2

## 2022-09-22 MED ORDER — LACTATED RINGERS IV SOLN: INTRAVENOUS | Status: DC

## 2022-09-22 MED ORDER — PHENYLEPHRINE 80 MCG/ML (10ML) SYRINGE FOR IV PUSH (FOR BLOOD PRESSURE SUPPORT): 80.0000 ug | PREFILLED_SYRINGE | INTRAVENOUS | Status: DC | PRN

## 2022-09-22 MED ORDER — LIDOCAINE HCL (PF) 1 % IJ SOLN
INTRAMUSCULAR | Status: DC | PRN
  Administered 2022-09-22: 8 mL via EPIDURAL

## 2022-09-22 MED ORDER — OXYTOCIN-SODIUM CHLORIDE 30-0.9 UT/500ML-% IV SOLN
1.0000 m[IU]/min | INTRAVENOUS | Status: DC
  Administered 2022-09-22: 2 m[IU]/min via INTRAVENOUS

## 2022-09-22 MED ORDER — DIPHENHYDRAMINE HCL 50 MG/ML IJ SOLN: 12.5000 mg | INTRAMUSCULAR | Status: DC | PRN

## 2022-09-22 MED ORDER — MISOPROSTOL 50MCG HALF TABLET
50.0000 ug | ORAL_TABLET | ORAL | Status: DC | PRN
  Administered 2022-09-22: 50 ug via VAGINAL
  Filled 2022-09-22: qty 1

## 2022-09-22 MED ORDER — PHENYLEPHRINE 80 MCG/ML (10ML) SYRINGE FOR IV PUSH (FOR BLOOD PRESSURE SUPPORT)
80.0000 ug | PREFILLED_SYRINGE | INTRAVENOUS | Status: DC | PRN
  Filled 2022-09-22: qty 10

## 2022-09-22 MED ORDER — MAGNESIUM SULFATE BOLUS VIA INFUSION
6.0000 g | Freq: Once | INTRAVENOUS | Status: AC
  Administered 2022-09-22: 6 g via INTRAVENOUS
  Filled 2022-09-22: qty 1000

## 2022-09-22 MED ORDER — BETAMETHASONE SOD PHOS & ACET 6 (3-3) MG/ML IJ SUSP
12.0000 mg | Freq: Two times a day (BID) | INTRAMUSCULAR | Status: AC
  Administered 2022-09-22: 12 mg via INTRAMUSCULAR

## 2022-09-22 MED ORDER — FENTANYL-BUPIVACAINE-NACL 0.5-0.125-0.9 MG/250ML-% EP SOLN: 12.0000 mL/h | EPIDURAL | Status: DC | PRN

## 2022-09-22 MED ORDER — LACTATED RINGERS IV SOLN
500.0000 mL | Freq: Once | INTRAVENOUS | Status: AC
  Administered 2022-09-22: 500 mL via INTRAVENOUS

## 2022-09-22 MED ORDER — EPHEDRINE 5 MG/ML INJ
10.0000 mg | INTRAVENOUS | Status: AC | PRN
  Administered 2022-09-22 (×2): 10 mg via INTRAVENOUS

## 2022-09-22 MED ORDER — LACTATED RINGERS IV SOLN
500.0000 mL | INTRAVENOUS | Status: DC | PRN
  Administered 2022-09-22: 500 mL via INTRAVENOUS

## 2022-09-22 NOTE — Progress Notes (Signed)
Pt removed from EFM to transport to CT. RN Sinclair Ship to accompany pt to CT due to being on mag

## 2022-09-22 NOTE — Progress Notes (Signed)
Patient is now s/p MFM scan and TTE MFM scan: EFW 2041g/13th%tile/4lb8oz, MVP 4.8cm, vtx TTE: Final report pending however measured EF >65%  Plan on moving up to L&D once bed is available. GBS ordered, pending results, may start PCN if moved upstairs before result. Change 2nd dose BMTZ to 12hrs after the first.   BP (!) 155/75   Pulse (!) 103   Temp 98.1 F (36.7 C) (Oral)   Resp 18   Ht 5\' 3"  (1.6 m)   Wt 103.9 kg   SpO2 99%   BMI 40.57 kg/m

## 2022-09-22 NOTE — Progress Notes (Signed)
S: Patient comfortable s/p epidural. Denies PreE symptoms, HA is minimal 2/10 at this time, main complaint is fatigue from all the events of today. Feeling some rectal pressure/lower abdominal pain O: BP (!) 104/42   Pulse (!) 109   Temp 98.6 F (37 C) (Oral)   Resp 20   Ht 5\' 3"  (1.6 m)   Wt 103.9 kg   SpO2 99%   BMI 40.57 kg/m  CE 1/50/-3, more mid-position and softer TOCO irregular Appears drowsy but appropriate respiratory effort.  Counseled on Foley balloon for ripening, vtx confirmed. Advised we will confirm vtx once bulb is out given preterm infant SIUP confirmed vtx. The 91fr Foley was introduced past the internal os and filled with 50cc NS. Tension applied and catheter taped to patient's inner right thigh. Patient tolerated procedure well.   TheraMag ordered, GBS still pending Continue Ancef as well as magnesium sulfate UOP 350cc/hr DTR 2+ BL patella, trace pedal edema BL

## 2022-09-22 NOTE — Anesthesia Procedure Notes (Signed)
Epidural Patient location during procedure: OB Start time: 09/22/2022 5:35 PM End time: 09/22/2022 5:40 PM  Staffing Anesthesiologist: Bethena Midget, MD  Preanesthetic Checklist Completed: patient identified, IV checked, site marked, risks and benefits discussed, surgical consent, monitors and equipment checked, pre-op evaluation and timeout performed  Epidural Patient position: sitting Prep: DuraPrep and site prepped and draped Patient monitoring: continuous pulse ox and blood pressure Approach: midline Location: L4-L5 Injection technique: LOR air  Needle:  Needle type: Tuohy  Needle gauge: 17 G Needle length: 9 cm and 9 Needle insertion depth: 8 cm Catheter type: closed end flexible Catheter size: 19 Gauge Catheter at skin depth: 13 cm Test dose: negative  Assessment Events: blood not aspirated, no cerebrospinal fluid, injection not painful, no injection resistance, no paresthesia and negative IV test

## 2022-09-22 NOTE — H&P (Signed)
Tina Dickson is a 27 y.o. G27P2002 female who presented to MAU yesterday at 49 6/7wks with complaint of contractions. Pt had felt intermittent contractions through the second trimester but reports became more painful yesterday. Rated pain at 4/10. Cervix closed/1cm in MAU.  While in MAU elevated BP noted and patient then reported intermediate HA for most of day as well. Pt has had elevated BPs at home but normal in office when seen and normal lfts and urine pr/cr She was given procardia 10mg  which did not fully resolve her contractions but did treat her BP. HA did not resolve. PreE labs were normal.  Pt has a history of irregular HR- no complications in previous pregnancies. She had a postpartum hemorrhage due to retained POC after her last child  She has a history of depression - not on meds  OB History     Gravida  3   Para  2   Term  2   Preterm  0   AB  0   Living  2      SAB  0   IAB  0   Ectopic  0   Multiple  0   Live Births  2        Obstetric Comments  G1 - reported increased bleeding requiring meds but no transfusion, per patient        Past Medical History:  Diagnosis Date   Abdominal pain, recurrent    characterized as crampy, spasmodic pain   Asthma    as a child- not since 2010   Chlamydia 08/2020   during pregnancy   Chronic constipation    Depression    Eczema    Past Surgical History:  Procedure Laterality Date   DILATION AND CURETTAGE OF UTERUS  04/2021   HERNIA REPAIR  1998   Family History: family history includes Asthma in her brother and mother; Cancer in her maternal grandmother; Diabetes in her father and paternal grandfather; Irritable bowel syndrome in her father; Lactose intolerance in her mother. Social History:  reports that she has never smoked. She has never used smokeless tobacco. She reports that she does not drink alcohol and does not use drugs.     Maternal Diabetes: No Genetic Screening: Normal Maternal  Ultrasounds/Referrals: Normal Fetal Ultrasounds or other Referrals:  None Maternal Substance Abuse:  No Significant Maternal Medications:  None Significant Maternal Lab Results:  Other:  GBS unknown Number of Prenatal Visits:greater than 3 verified prenatal visits Other Comments:  None  Review of Systems  Constitutional:  Positive for activity change and fatigue. Negative for chills and fever.  Eyes:  Negative for photophobia and visual disturbance.  Respiratory:  Negative for chest tightness and shortness of breath.   Cardiovascular:  Negative for chest pain, palpitations and leg swelling.  Gastrointestinal:  Positive for abdominal pain and nausea. Negative for diarrhea and vomiting.  Genitourinary:  Positive for pelvic pain. Negative for dysuria, hematuria, vaginal bleeding and vaginal discharge.  Musculoskeletal:  Negative for back pain.  Neurological:  Positive for dizziness, weakness, light-headedness and headaches. Negative for tremors, seizures, syncope, facial asymmetry and numbness.  Psychiatric/Behavioral:  Positive for dysphoric mood. Negative for agitation and confusion. The patient is not nervous/anxious.    Maternal Medical History:  Reason for admission: Nausea.   Contractions: Onset was 2 days ago.   Frequency: irregular.   Perceived severity is moderate.   Fetal activity: Perceived fetal activity is normal.   Last perceived fetal movement was within the  past hour.   Prenatal complications: no prenatal complications Prenatal Complications - Diabetes: none.   Dilation: Fingertip Effacement (%): 50 Station: -3, Ballotable Exam by:: M Williams CNM Blood pressure (!) 139/99, pulse (!) 117, temperature 98.1 F (36.7 C), temperature source Oral, resp. rate 20, height 5\' 3"  (1.6 m), weight 103.9 kg, SpO2 99 %, unknown if currently breastfeeding. Maternal Exam:  Uterine Assessment: Contraction strength is moderate.  Contraction frequency is irregular.  Abdomen: Patient  reports generalized tenderness.  Estimated fetal weight is AGA.   Introitus: Normal vulva. Vulva is negative for condylomata and lesion.  Normal vagina.  Vagina is negative for condylomata.  Pelvis: adequate for delivery.   Cervix: Cervix evaluated by digital exam.     Fetal Exam Fetal Monitor Review: Baseline rate: 150.  Variability: moderate (6-25 bpm).   Pattern: accelerations present and no decelerations.   Fetal State Assessment: Category I - tracings are normal.   Physical Exam Constitutional:      Appearance: She is normal weight.  HENT:     Nose: No congestion or rhinorrhea.  Eyes:     Extraocular Movements: Extraocular movements intact.     Conjunctiva/sclera: Conjunctivae normal.     Pupils: Pupils are equal, round, and reactive to light.  Cardiovascular:     Rate and Rhythm: Normal rate.     Pulses: Normal pulses.  Pulmonary:     Effort: Pulmonary effort is normal.  Abdominal:     Palpations: Abdomen is soft.     Tenderness: There is generalized abdominal tenderness. There is no right CVA tenderness, left CVA tenderness or guarding.  Genitourinary:    General: Normal vulva.  Vulva is no lesion.  Musculoskeletal:        General: Normal range of motion.     Cervical back: Normal range of motion.  Skin:    General: Skin is warm and dry.     Capillary Refill: Capillary refill takes 2 to 3 seconds.  Neurological:     General: No focal deficit present.     Mental Status: She is alert and oriented to person, place, and time.     Cranial Nerves: No cranial nerve deficit.     Motor: Weakness present.     Coordination: Coordination normal.     Gait: Gait normal.     Deep Tendon Reflexes: Reflexes normal.     Prenatal labs: ABO, Rh:   Antibody:   Rubella:   RPR:    HBsAg:    HIV:    GBS:     Assessment/Plan: [id number] [id number] female at 34 wks with multiple complaints - Admit for observation  - Differential diagnosis includes PIH, atypical preE, atypical  migraines, preterm contractions    PLAN :  - given normal preE labs and BP as low as 96/55, decision made to hold on procardia and magnesium sulfate at this time. Planned to check repeat preE labs in am however as HA increased in intensity to 7/10/ intractible/not responsive to medications including HA cocktail, excedrin, oxycodone, fioricet, decision made to obtain an MRI to rule out neurologic cause. Neuro exam nl   - pt given zofran and reglanfor nausea; mild resolution   - cervix unchanged after 3 hrs ; still ft/th/high so not concerning for preterm labor  - Pending results of MRI will consider delivery implications and whether pt will need BMZ  Cathrine Muster 09/22/2022, 3:18 AM

## 2022-09-22 NOTE — Progress Notes (Signed)
Patient seen and evaluated after MFM consult note, please see prior note. Is aware of plan in regards to r/o pulmonary embolism and proceeding with delivery if imaging is negative. Orders placed for limited MFM OB plus stat CT angio. Patient is amenable to blood transfusion if needed, will type and cross 2 units pRBCs. Of note, procardia30XL every day held as IR procardia given in MAU. Would continue with this now (may slightly aggrecate headache, pt aware) given persistent mild range pressures BP (!) 146/82   Pulse 91   Temp 98.1 F (36.7 C) (Oral)   Resp 18   Ht 5\' 3"  (1.6 m)   Wt 103.9 kg   SpO2 99%   BMI 40.57 kg/m  Of note, patient has not been tachycardic since 0600, HR consistently in 90s. Close eye on status, will f/u imaging

## 2022-09-22 NOTE — Consult Note (Addendum)
..  Consultation Service: Neonatology   OB Service requested consultation on Tina Dickson regarding the care of a premature infant at 34 wks. Thank you for inviting [place] to see this patient.    My key findings of this patient's HPI are:  I have reviewed the patient's chart and have met with her. The salient information is as follows:  27 yo G3P2 female at 15+0 wga singleton, who presented 6/20 with contractions, elevated BP and HA.  Her pregnancy has otherwise been relatively uncomplicated, h/o depression.  She was admitted for observation and follow closely for PreE.  She had a normal MRV and CTA of head and neck.  Her BP continued labile and she was diagnosed with Pre E with severe features give continued HA and absence of non obstetric intracranial pathology.  BMZ was initiated, and Mag infusion started.  Contractions have stopped.  Plan for delivery once bmz complete.   MFM Scan 6/21: EFW 2041g/13th%tile/4lb8oz, MVP 4.8cm, vtx   Prenatal labs:reviewed   Prenatal care:   good  Maternal Steroids: BMZ, first dose 6/21 @ 0540   My recommendations for this patient and my actions included:   1.I spent 25 minutes discussing the possible complications and outcomes of prematurity at this gestational age. I discussed the potential need for resuscitation at birth, mechanical ventilation and surfactant administration for respiratory distress, IV fluids pending establishment of enteral feeds (encouraged breast milk feeding to which she planned to do), antibiotics for possible sepsis, temperature support, and monitoring. I also discussed the potential risk of complications such as intracranial hemorrhage, retinopathy, hearing deficit, and chronic lung disease. I also discussed the potential length of stay in the neonatal intensive care unit for about 1-6 weeks. I discussed this with parents in detail and they expressed an understanding of the risks and complications of prematurity.   2. I also discussed the  expected survival of an infant born at 76 wk, which is near 100%. We further discussed that very few of neonates born at this age have severe neurological complications or school difficulties. She expressed an understanding of this information.   3. I informed her that the NICU team would be present at the delivery. She was counseled on the potential immediate measures which may be required to resuscitate her infant at the delivery. I stressed that we will make every effort to let her see her baby before transfer to NICU, but that, depending on the clinical condition of her child, that may not be possible.  I recommended that a support person/ parent accompany [place] to the NICU during the transfer, and reviewed that a member of the care team would return to update her as soon as possible after stabilization of her baby.    4. A visit to the NICU by the infant's mother and/or a significant other was encouraged. Visitation policy was discussed.     Final Impression:  27 yo  female with a viable IUP who is has a high risk medical condition which may prompt preterm delivery, and who now understands the possible complications and prognosis of her infant.   Thank you for this consultation request and please alert the neonatology team if further questions or concerns arise, or the plan for delivery abruptly changes where additional prenatal counseling would benefit this patient.    Face to face consultation time: 25 Chart Review time: 20 Total Consultation time: 30   Tina Bellini, MD NICU Attending

## 2022-09-22 NOTE — Progress Notes (Signed)
Mag level returned at 5.5. Patient able to nap for 1.5hrs and feels much improved BP (!) 109/58   Pulse (!) 103   Temp 98.4 F (36.9 C) (Oral)   Resp 20   Ht 5\' 3"  (1.6 m)   Wt 103.9 kg   SpO2 99%   BMI 40.57 kg/m  FB inplace Cat 1 tracing, TOCO q2-99m  Will initiate pitocin and hold at 50mU/min, goal to keep intact until close to delivery. Normotensive from 1840

## 2022-09-22 NOTE — Progress Notes (Addendum)
Patient ID: Tina Dickson, female   DOB: [birth year], 27 y.o.   MRN: [id number]  MRV results neg :  The dural sinuses are diffusely patent. No evidence of cortical or deep vein thrombosis as well. The visible brain shows no shift, extra-axial collection, or hydrocephalus.  BP continues to be labile  129-139/99-108  Tachycardia persistent   Discussed mgmt with MFM Plan:  Start on magnesium sulfate for neuro prophylaxis now  BMZ now  Remote telemetry started ECHO ordered Consult placed for cardiology consult  CTA/P head ordered to r/o PE Check BNP, LDH, uric acid; repeat CBC and cmp Labetalol protocol prn  MFM consult Neonatology consult

## 2022-09-22 NOTE — Progress Notes (Signed)
Ce still ftp/50/-3, will do PV cytotec 50mc x1, reassess. Echo WNL

## 2022-09-22 NOTE — Consult Note (Addendum)
MFM Consult Note Patient Name: Tina Dickson [facility]  Patient MRN:   [id number]  Referring provider: Dr. Mindi Slicker  Reason for Consult: headache and elevated BP   HPI: Tina Dickson is a 27 y.o. G3P2002 at [redacted]w[redacted]d admitted for preeclampsia.   The patient reports that she developed a headache 2 days ago.  She also had intermittent contractions that became more painful in the last 2 days.  Upon presenting to the MAU she had multiple elevated blood pressures contracting consistently. She received 10 mg of fast acting Procardia for contractions but this dropped her blood pressure too low.  Her blood pressure has been very labile during her hospital stay.  As soon as she ambulates her blood pressure becomes elevated and she has a headache but then as she lies down her blood pressure improves or will drop even low.  She denies visual disturbances.  She reports occasional tingling in her lips and fingers but no motor deficits.  She denies any history of headaches outside of pregnancy.  MRV and CTA of the head and neck were negative.  Echocardiogram is pending.  Her BNP, serum chemistry and CBC's were unremarkable for signs of preeclampsia.  The patient reports a history of postpartum hemorrhage x 2 but denies a history of blood transfusion.  She also has heavy menses and bled extensively with extraction of wisdom teeth.  I discussed the possibility of von Willebrand's disease and the need for aggressively managing postpartum bleeding.  We will have the postpartum hemorrhage cart readily available.  Review of Systems: A review of systems was performed and was negative except per HPI   Past Obstetrical History:  OB History  Gravida Para Term Preterm AB Living  3 2 2  0 0 2  SAB IAB Ectopic Multiple Live Births  0 0 0 0 2    # Outcome Date GA Lbr Len/2nd Weight Sex Delivery Anes PTL Lv  3 Current           2 Term 04/18/21 [redacted]w[redacted]d 13:22 / 00:11 3430 g F Vag-Spont EPI  LIV  1 Term 12/28/16 [redacted]w[redacted]d 26:40 / 01:04 3280  g F Vag-Spont EPI, Local  LIV     Birth Comments: elevated BP on admission and hypotension after delivery    Obstetric Comments  G1 - reported increased bleeding requiring meds but no transfusion, per patient  Past Gynecologic History:  Not discussed  Past Medical History:  Past Medical History:  Diagnosis Date   Abdominal pain, recurrent    characterized as crampy, spasmodic pain   Asthma    as a child- not since 2010   Chlamydia 08/2020   during pregnancy   Chronic constipation    Depression    Eczema        Past Surgical History:    Past Surgical History:  Procedure Laterality Date   DILATION AND CURETTAGE OF UTERUS  04/2021   HERNIA REPAIR  1998     Family History:   family history includes Asthma in her brother and mother; Cancer in her maternal grandmother; Diabetes in her father and paternal grandfather; Irritable bowel syndrome in her father; Lactose intolerance in her mother.    Social History:   Social History   Socioeconomic History   Marital status: Married    Spouse name: Not on file   Number of children: Not on file   Years of education: Not on file   Highest education level: Not on file  Occupational History   Not on file  Tobacco Use   Smoking status: Never   Smokeless tobacco: Never  Vaping Use   Vaping Use: Never used  Substance and Sexual Activity   Alcohol use: No   Drug use: No   Sexual activity: Yes  Other Topics Concern   Not on file  Social History Narrative   Lives with Mom and brother together with grandmother and her husband.   Social Determinants of Health   Financial Resource Strain: Not on file  Food Insecurity: No Food Insecurity (09/22/2022)   Hunger Vital Sign    Worried About Running Out of Food in the Last Year: Never true    Ran Out of Food in the Last Year: Never true  Transportation Needs: No Transportation Needs (09/22/2022)   PRAPARE - Administrator, Civil Service (Medical): No    Lack of  Transportation (Non-Medical): No  Physical Activity: Not on file  Stress: Not on file  Social Connections: Not on file  Intimate Partner Violence: Not At Risk (09/22/2022)   Humiliation, Afraid, Rape, and Kick questionnaire    Fear of Current or Ex-Partner: No    Emotionally Abused: No    Physically Abused: No    Sexually Abused: No      Home Medications:   No current facility-administered medications on file prior to encounter.   Current Outpatient Medications on File Prior to Encounter  Medication Sig Dispense Refill   ondansetron (ZOFRAN) 4 MG tablet Take 1 tablet (4 mg total) by mouth every 8 (eight) hours as needed for nausea or vomiting. 20 tablet 0   acetaminophen (TYLENOL) 500 MG tablet Take 1,000 mg by mouth every 6 (six) hours as needed.     ferrous sulfate 325 (65 FE) MG tablet Take 1 tablet (325 mg total) by mouth daily with breakfast. 30 tablet 3   ibuprofen (ADVIL) 600 MG tablet Take 1 tablet (600 mg total) by mouth every 6 (six) hours as needed for moderate pain or cramping. 40 tablet 1   ondansetron (ZOFRAN-ODT) 4 MG disintegrating tablet Take 1 tablet (4 mg total) by mouth every 6 (six) hours as needed for nausea. 20 tablet 0   Prenatal Vit-Fe Fumarate-FA (PRENATAL MULTIVITAMIN) TABS tablet Take 1 tablet by mouth daily at 12 noon.     promethazine (PHENERGAN) 25 MG tablet Take 25 mg by mouth every 6 (six) hours as needed for nausea or vomiting.     [DISCONTINUED] cetirizine (ZYRTEC) 10 MG tablet Take 1 tablet (10 mg total) by mouth daily for 10 days. (Patient not taking: Reported on 01/18/2018) 10 tablet 0      Allergies:   Allergies  Allergen Reactions   Latex Swelling   Penicillins Other (See Comments)    Gets severe yeast infections with cillins     Physical Exam:      09/22/2022    9:00 AM 09/22/2022    8:16 AM 09/22/2022    8:09 AM  Vitals with BMI  Systolic 146 146 604  Diastolic 82 78 115  Pulse 91 94 93  Sitting comfortably on the sonogram  table Nonlabored breathing Normal rate and rhythm Abdomen is non-tender Deep tendon reflexes were +2 bilaterally in the lower extremity  Assessment  Tina Dickson is a 27 y.o. G3P2002 at [redacted]w[redacted]d admitted for the following  1. Preeclampsia with severe features -Given the patient's new onset elevated blood pressure and headache in pregnancy I think she has preeclampsia with severe features despite the lack of laboratory evidence and her  serum chemistry.  CNS imaging was negative ruling out nonobstetric intracranial pathology.  I discussed the importance of ruling out pulmonary embolism given the instability of her blood pressure as well as following up on her echocardiogram.  I discussed the concern for preeclampsia and the potential maternal and fetal consequences if left untreated with the patient.  Also discussed the need for delivery since she is 34 weeks.  She verbalized understanding and is agreeable to the plan. 2.  History of postpartum hemorrhage x 2 Recommendations -CBC and CMP every 6 hours or sooner as indicated -CT of the chest to rule out pulmonary embolism -Follow-up on echocardiogram -Consider neurology consultation if headaches persist despite treatment with analgesics -NICU consultation  -BMZ course -Magnesium sulfate infusion until 24h postpartum -Antihypertensive medications for persistent (confirmed over 15 min) severe range BP (>160/110) -Continuous fetal monitoring -Ultrasound for fetal position and growth -Proceed with delivery at pulmonary imaging since she is 34 weeks -Immediate upon delivery give TXA, Cytotec and Pitocin to minimize bleeding.  Avoid Hemabate and Methergine due to preeclampsia. -Diagnostic workup for von Willebrand's disease should be done after pregnancy. -Type and cross for 2 units  Thank you for the opportunity to be involved with this patient's care. Please contact MFM with any further questions 60 minutes was spent in consultation with the  patient including counseling and chart review.  Tina Dickson  MFM, [facility]  [phone]  09/22/2022  8:21 AM

## 2022-09-22 NOTE — Anesthesia Preprocedure Evaluation (Signed)
Anesthesia Evaluation  Patient identified by MRN, date of birth, ID band Patient awake    Reviewed: Allergy & Precautions, H&P , NPO status , Patient's Chart, lab work & pertinent test results  Airway Mallampati: I  TM Distance: >3 FB Neck ROM: Full    Dental no notable dental hx.    Pulmonary neg pulmonary ROS, asthma    Pulmonary exam normal breath sounds clear to auscultation       Cardiovascular hypertension, Pt. on medications and Pt. on home beta blockers negative cardio ROS Normal cardiovascular exam Rhythm:Regular Rate:Normal     Neuro/Psych  PSYCHIATRIC DISORDERS  Depression    negative neurological ROS  negative psych ROS   GI/Hepatic negative GI ROS, Neg liver ROS,,,  Endo/Other    Morbid obesity  Renal/GU negative Renal ROS  negative genitourinary   Musculoskeletal negative musculoskeletal ROS (+)    Abdominal   Peds negative pediatric ROS (+)  Hematology  (+) Blood dyscrasia, anemia   Anesthesia Other Findings   Reproductive/Obstetrics negative OB ROS                             Anesthesia Physical Anesthesia Plan  ASA: 4  Anesthesia Plan: Epidural   Post-op Pain Management: Minimal or no pain anticipated   Induction: Intravenous  PONV Risk Score and Plan: 2 and Ondansetron, Dexamethasone and Treatment may vary due to age or medical condition  Airway Management Planned: Natural Airway and Simple Face Mask  Additional Equipment: None  Intra-op Plan:   Post-operative Plan:   Informed Consent: I have reviewed the patients History and Physical, chart, labs and discussed the procedure including the risks, benefits and alternatives for the proposed anesthesia with the patient or authorized representative who has indicated his/her understanding and acceptance.     Dental Advisory Given  Plan Discussed with: Anesthesiologist  Anesthesia Plan Comments: (Labs  checked- platelets confirmed with RN in room. Fetal heart tracing, per RN, reported to be stable enough for sitting procedure. Discussed epidural, and patient consents to the procedure:  included risk of possible headache,backache, failed block, allergic reaction, and nerve injury. This patient was asked if she had any questions or concerns before the procedure started.  H/o pp hemorrhagic event 2 LB IV's )       Anesthesia Quick Evaluation

## 2022-09-23 ENCOUNTER — Encounter (HOSPITAL_COMMUNITY): Payer: Self-pay | Admitting: Obstetrics and Gynecology

## 2022-09-23 LAB — COMPREHENSIVE METABOLIC PANEL
ALT: 45 U/L — ABNORMAL HIGH (ref 0–44)
ALT: 131 U/L — ABNORMAL HIGH (ref 0–44)
AST: 35 U/L (ref 15–41)
AST: 92 U/L — ABNORMAL HIGH (ref 15–41)
Albumin: 2.6 g/dL — ABNORMAL LOW (ref 3.5–5.0)
Albumin: 2.5 g/dL — ABNORMAL LOW (ref 3.5–5.0)
Alkaline Phosphatase: 107 U/L (ref 38–126)
Alkaline Phosphatase: 100 U/L (ref 38–126)
Anion gap: 10 (ref 5–15)
Anion gap: 9 (ref 5–15)
BUN: <5 mg/dL — ABNORMAL LOW (ref 6–20)
BUN: <5 mg/dL — ABNORMAL LOW (ref 6–20)
CO2: 19 mmol/L — ABNORMAL LOW (ref 22–32)
CO2: 18 mmol/L — ABNORMAL LOW (ref 22–32)
Calcium: 6.7 mg/dL — ABNORMAL LOW (ref 8.9–10.3)
Calcium: 6.3 mg/dL — CL (ref 8.9–10.3)
Chloride: 106 mmol/L (ref 98–111)
Chloride: 108 mmol/L (ref 98–111)
Creatinine, Ser: 0.7 mg/dL (ref 0.44–1.00)
Creatinine, Ser: 0.68 mg/dL (ref 0.44–1.00)
GFR, Estimated: >60 mL/min (ref >60)
GFR, Estimated: >60 mL/min (ref >60)
Glucose, Bld: 129 mg/dL — ABNORMAL HIGH (ref 70–99)
Glucose, Bld: 113 mg/dL — ABNORMAL HIGH (ref 70–99)
Potassium: 3.9 mmol/L (ref 3.5–5.1)
Potassium: 3.4 mmol/L — ABNORMAL LOW (ref 3.5–5.1)
Sodium: 135 mmol/L (ref 135–145)
Sodium: 135 mmol/L (ref 135–145)
Total Bilirubin: 0.3 mg/dL (ref 0.3–1.2)
Total Bilirubin: 0.5 mg/dL (ref 0.3–1.2)
Total Protein: 6.3 g/dL — ABNORMAL LOW (ref 6.5–8.1)
Total Protein: 6 g/dL — ABNORMAL LOW (ref 6.5–8.1)

## 2022-09-23 LAB — CBC WITH DIFFERENTIAL/PLATELET
Abs Immature Granulocytes: 0.04 10*3/uL (ref 0.00–0.07)
Abs Immature Granulocytes: 0.03 10*3/uL (ref 0.00–0.07)
Basophils Absolute: 0 10*3/uL (ref 0.0–0.1)
Basophils Absolute: 0 10*3/uL (ref 0.0–0.1)
Basophils Relative: 0 %
Basophils Relative: 0 %
Eosinophils Absolute: 0 10*3/uL (ref 0.0–0.5)
Eosinophils Absolute: 0 10*3/uL (ref 0.0–0.5)
Eosinophils Relative: 0 %
Eosinophils Relative: 0 %
HCT: 31.9 % — ABNORMAL LOW (ref 36.0–46.0)
HCT: 30.4 % — ABNORMAL LOW (ref 36.0–46.0)
Hemoglobin: 10 g/dL — ABNORMAL LOW (ref 12.0–15.0)
Hemoglobin: 9.5 g/dL — ABNORMAL LOW (ref 12.0–15.0)
Immature Granulocytes: 1 %
Immature Granulocytes: 0 %
Lymphocytes Relative: 10 %
Lymphocytes Relative: 11 %
Lymphs Abs: 0.8 10*3/uL (ref 0.7–4.0)
Lymphs Abs: 0.8 10*3/uL (ref 0.7–4.0)
MCH: 23.8 pg — ABNORMAL LOW (ref 26.0–34.0)
MCH: 23.6 pg — ABNORMAL LOW (ref 26.0–34.0)
MCHC: 31.3 g/dL (ref 30.0–36.0)
MCHC: 31.3 g/dL (ref 30.0–36.0)
MCV: 75.8 fL — ABNORMAL LOW (ref 80.0–100.0)
MCV: 75.4 fL — ABNORMAL LOW (ref 80.0–100.0)
Monocytes Absolute: 0.6 10*3/uL (ref 0.1–1.0)
Monocytes Absolute: 0.5 10*3/uL (ref 0.1–1.0)
Monocytes Relative: 8 %
Monocytes Relative: 7 %
Neutro Abs: 7 10*3/uL (ref 1.7–7.7)
Neutro Abs: 5.5 10*3/uL (ref 1.7–7.7)
Neutrophils Relative %: 81 %
Neutrophils Relative %: 82 %
Platelets: 187 10*3/uL (ref 150–400)
Platelets: 163 10*3/uL (ref 150–400)
RBC: 4.21 MIL/uL (ref 3.87–5.11)
RBC: 4.03 MIL/uL (ref 3.87–5.11)
RDW: 15.6 % — ABNORMAL HIGH (ref 11.5–15.5)
RDW: 15.5 % (ref 11.5–15.5)
WBC: 8.4 10*3/uL (ref 4.0–10.5)
WBC: 6.8 10*3/uL (ref 4.0–10.5)
nRBC: 0 % (ref 0.0–0.2)
nRBC: 0 % (ref 0.0–0.2)

## 2022-09-23 LAB — CULTURE, BETA STREP (GROUP B ONLY)

## 2022-09-23 MED ORDER — MISOPROSTOL 200 MCG PO TABS: 1000.0000 ug | ORAL_TABLET | Freq: Once | ORAL | Status: AC

## 2022-09-23 MED ORDER — FENTANYL CITRATE (PF) 100 MCG/2ML IJ SOLN
INTRAMUSCULAR | Status: DC | PRN
  Administered 2022-09-23: 100 ug via EPIDURAL

## 2022-09-23 MED ORDER — MISOPROSTOL 200 MCG PO TABS
ORAL_TABLET | ORAL | Status: AC
  Administered 2022-09-23: 1000 ug via RECTAL
  Filled 2022-09-23: qty 5

## 2022-09-23 MED ORDER — AMMONIA AROMATIC IN INHA
RESPIRATORY_TRACT | Status: AC
  Filled 2022-09-23: qty 10

## 2022-09-23 MED ORDER — BUPIVACAINE HCL (PF) 0.25 % IJ SOLN
INTRAMUSCULAR | Status: DC | PRN
  Administered 2022-09-23: 8 mL via EPIDURAL

## 2022-09-23 MED ORDER — CALCIUM GLUCONATE-NACL 1-0.675 GM/50ML-% IV SOLN
1.0000 g | Freq: Once | INTRAVENOUS | Status: AC
  Administered 2022-09-23: 1000 mg via INTRAVENOUS
  Filled 2022-09-23: qty 50

## 2022-09-23 MED ORDER — SODIUM CHLORIDE 0.9 % IV SOLN
25.0000 mg | Freq: Once | INTRAVENOUS | Status: AC
  Administered 2022-09-23: 25 mg via INTRAVENOUS
  Filled 2022-09-23: qty 1

## 2022-09-23 MED ORDER — TRANEXAMIC ACID-NACL 1000-0.7 MG/100ML-% IV SOLN
INTRAVENOUS | Status: AC
  Administered 2022-09-23: 1000 mg
  Filled 2022-09-23: qty 100

## 2022-09-23 NOTE — Progress Notes (Signed)
No complaints, only fatigue, has had small naps, HA seems to be improving BP 118/63   Pulse 95   Temp 97.7 F (36.5 C) (Oral)   Resp 20   Ht 5\' 3"  (1.6 m)   Wt 103.9 kg   SpO2 100%   BMI 40.57 kg/m  CE 3-4/70/-2, well-applied and more mid-position than last exam TOCO q4-5, pitocin at 9mU/min  UOP 144/last hour  GBS still pending, CE more favorable. Plan for AROM with next exam, delivery may follow quickly given preterm infant and multip status. Epidural working well. Repeat PreE labs ordered this AM. Normotensive since 0200

## 2022-09-23 NOTE — Progress Notes (Signed)
Notified MD Marinone of critical Calcium level of 6.3. Doctor states will place new orders.

## 2022-09-23 NOTE — Progress Notes (Signed)
Occasional nausea but is otherwise well, sitting up in bed BP (!) 140/83 (BP Location: Right Arm)   Pulse 97   Temp 97.7 F (36.5 C)   Resp 20   Ht 5\' 3"  (1.6 m)   Wt 103.9 kg   SpO2 97%   BMI 40.57 kg/m  FB out, CE 3/50/-3, tense bag TOCO q4-5, pitocin at 37mU/min  UOP 150/last hour  S/p FB for cervical ripening, SIUP still vertex. Increase pitocin per protocol at this time, anticipate SVD. Patient still with good pressure sensation, advised to call out earlier rather than later with pressure increase.

## 2022-09-23 NOTE — Progress Notes (Signed)
   09/23/22 2218  Spiritual Encounters  Type of Visit Initial  Care provided to: [facility] partners present during encounter Physician  Referral source Nurse (RN/NT/LPN)  Reason for visit Code  OnCall Visit Yes   Responded to code blue page, arrived mother and baby doing well. Page called as baby exited fast, code no longer required as mother and baby stabilized.  Father at the baby's side with nurse who stated things are ok (cancelled code). Advise to call again if needed. Will have follow-up request for mother in chaplain log.

## 2022-09-23 NOTE — Progress Notes (Signed)
OB Progress Note  S: pt feeling some rectal pressure   O: Today's Vitals   09/23/22 1500 09/23/22 1506 09/23/22 1516 09/23/22 1530  BP: 110/66   119/78  Pulse: 98   89  Resp:   16   Temp:   97.9 F (36.6 C)   TempSrc:   Oral   SpO2: 98% 100% 100% 100%  Weight:      Height:      PainSc:   0-No pain    Body mass index is 40.57 kg/m.  SVE 5/70/-2, IUPC placed  FHR: 125bpm,mod variability, + accels, no decels Toco: ctx q 2-5 mins   A/P: 26Y G3P2002 @ [redacted]w[redacted]d, IOL severe preeclampsia Fetal wellbeing: cat I tracing IOL: s/p cytotec, s/p foley bulb, s/p AROM, continue pitocin (currently at 55mu/min), IUPC now in place, assess MVUs and adjust as needed Premature: s/p NICU consult, EFW 4lb8oz=2041g (13%) yesterday, s/p 2 doses betamethasone (6/21 0541 at 1851) Severe preeclampsia: on mag for seizure prophylaxis, on Procardia XL 30mg  daily,  liver enzymes trending up (AST/ALT 35/45 today from 18/19 yesterday).  Pain control: epidural GBS pending: Ancef    M. Timothy Lasso, MD 09/23/22 3:45 PM

## 2022-09-23 NOTE — Progress Notes (Addendum)
OB Progress Note  S: feeling some abdominal pain with contractions. Sleepy after phenergan but feeling okay otherwise. Nausea resolved.    O: Today's Vitals   09/23/22 1900 09/23/22 1930 09/23/22 2000 09/23/22 2030  BP: 118/73 123/71 (!) 106/28 (!) 96/42  Pulse: 87 87 83 81  Resp:  18    Temp:  98.2 F (36.8 C)    TempSrc:  Oral    SpO2: 99%  100% 94%  Weight:      Height:      PainSc:  3      Body mass index is 40.57 kg/m.  SVE 5-6/90/-1  FHR: 125bpm, mod variability, + accels, no decels  Toco: ctx q 1-4 mins     Latest Ref Rng & Units 09/23/2022    7:40 PM 09/23/2022    7:42 AM 09/22/2022    6:25 PM  CBC  WBC 4.0 - 10.5 K/uL 6.8  8.4  6.5   Hemoglobin 12.0 - 15.0 g/dL 9.5  16.1  09.6   Hematocrit 36.0 - 46.0 % 30.4  31.9  32.7   Platelets 150 - 400 K/uL 163  187  201        Latest Ref Rng & Units 09/23/2022    7:40 PM 09/23/2022    7:42 AM 09/22/2022   12:17 PM  CMP  Glucose 70 - 99 mg/dL 045  409  811   BUN 6 - 20 mg/dL <5  <5  <5   Creatinine 0.44 - 1.00 mg/dL 9.14  7.82  9.56   Sodium 135 - 145 mmol/L 135  135  135   Potassium 3.5 - 5.1 mmol/L 3.4  3.9  4.1   Chloride 98 - 111 mmol/L 108  106  109   CO2 22 - 32 mmol/L 18  19  18    Calcium 8.9 - 10.3 mg/dL 6.3  6.7  7.4   Total Protein 6.5 - 8.1 g/dL 6.0  6.3  6.2   Total Bilirubin 0.3 - 1.2 mg/dL 0.5  0.3  0.2   Alkaline Phos 38 - 126 U/L 100  107  101   AST 15 - 41 U/L 92  35  18   ALT 0 - 44 U/L 131  45  19      A/P: 26Y G3P2002 @ [redacted]w[redacted]d, IOL severe preeclampsia Fetal wellbeing: cat I tracing IOL: s/p cytotec, s/p foley bulb, s/p AROM, continue pitocin (currently at 25mu/min, holding at this dose), MVUs adequate with IUPC. Cervix is finally thinned and fetal vertex well applied to cervix, hopeful she is transitioning into active labor.  Premature: s/p NICU consult, EFW 4lb8oz=2041g (13%) yesterday, s/p 2 doses betamethasone (6/21 0541 at 1851) Severe preeclampsia: on mag for seizure prophylaxis, on  Procardia XL 30mg  daily,  liver enzymes trending up as listed above. Discussed with patient, continuing to move toward delivery.  Pain control: epidural GBS pending: Ancef  Hypocalcemia: IV repletion ordered   M. Timothy Lasso, MD 09/23/22 8:54 PM

## 2022-09-23 NOTE — Progress Notes (Addendum)
OB Progress Note  S: comfortable, tired but feeling okay.    O: Today's Vitals   09/23/22 1030 09/23/22 1100 09/23/22 1116 09/23/22 1121  BP: (!) 123/54 111/66    Pulse: 87 92    Resp:    18  Temp:      TempSrc:      SpO2: 100% 100% 100% 100%  Weight:      Height:      PainSc:       Body mass index is 40.57 kg/m.  SVE 4/70/-2 AROM clear fluid, vertex  FHR: 120bpm, mod variability, + accels, no decels Toco:  ctx q 3 mins   CBC    Component Value Date/Time   WBC 8.4 09/23/2022 0742   RBC 4.21 09/23/2022 0742   HGB 10.0 (L) 09/23/2022 0742   HCT 31.9 (L) 09/23/2022 0742   PLT 187 09/23/2022 0742   MCV 75.8 (L) 09/23/2022 0742   MCH 23.8 (L) 09/23/2022 0742   MCHC 31.3 09/23/2022 0742   RDW 15.6 (H) 09/23/2022 0742   LYMPHSABS 0.8 09/23/2022 0742   MONOABS 0.6 09/23/2022 0742   EOSABS 0.0 09/23/2022 0742   BASOSABS 0.0 09/23/2022 0742     CMP     Component Value Date/Time   NA 135 09/23/2022 0742   K 3.9 09/23/2022 0742   CL 106 09/23/2022 0742   CO2 19 (L) 09/23/2022 0742   GLUCOSE 129 (H) 09/23/2022 0742   BUN <5 (L) 09/23/2022 0742   CREATININE 0.70 09/23/2022 0742   CALCIUM 6.7 (L) 09/23/2022 0742   PROT 6.3 (L) 09/23/2022 0742   ALBUMIN 2.6 (L) 09/23/2022 0742   AST 35 09/23/2022 0742   ALT 45 (H) 09/23/2022 0742   ALKPHOS 107 09/23/2022 0742   BILITOT 0.3 09/23/2022 0742   GFRNONAA >60 09/23/2022 0742     A/P: 26Y G3P2002 @ [redacted]w[redacted]d, IOL severe preeclampsia Fetal wellbeing: cat I tracing IOL: s/p cytotec, s/p foley bulb, s/p AROM, continue pitocin (currently at 68mu/min) Premature: s/p NICU consult, EFW 4lb8oz=2041g (13%) yesterday, s/p 2 doses betamethasone (6/21 0541 at 1851) Severe preeclampsia: on mag for seizure prophylaxis, on Procardia XL 30mg  daily,  liver enzymes trending up (AST/ALT 35/45 today from 18/19 yesterday).  Pain control: epidural GBS pending: Ancef   M. Timothy Lasso, MD 09/23/22 11:31 AM

## 2022-09-24 LAB — COMPREHENSIVE METABOLIC PANEL
ALT: 128 U/L — ABNORMAL HIGH (ref 0–44)
AST: 72 U/L — ABNORMAL HIGH (ref 15–41)
Albumin: 2.2 g/dL — ABNORMAL LOW (ref 3.5–5.0)
Alkaline Phosphatase: 83 U/L (ref 38–126)
Anion gap: 8 (ref 5–15)
BUN: <5 mg/dL — ABNORMAL LOW (ref 6–20)
CO2: 18 mmol/L — ABNORMAL LOW (ref 22–32)
Calcium: 6.2 mg/dL — CL (ref 8.9–10.3)
Chloride: 108 mmol/L (ref 98–111)
Creatinine, Ser: 0.8 mg/dL (ref 0.44–1.00)
GFR, Estimated: >60 mL/min (ref >60)
Glucose, Bld: 120 mg/dL — ABNORMAL HIGH (ref 70–99)
Potassium: 3.6 mmol/L (ref 3.5–5.1)
Sodium: 134 mmol/L — ABNORMAL LOW (ref 135–145)
Total Bilirubin: 0.1 mg/dL — ABNORMAL LOW (ref 0.3–1.2)
Total Protein: 5.5 g/dL — ABNORMAL LOW (ref 6.5–8.1)

## 2022-09-24 LAB — CBC
HCT: 26.8 % — ABNORMAL LOW (ref 36.0–46.0)
Hemoglobin: 8.4 g/dL — ABNORMAL LOW (ref 12.0–15.0)
MCH: 23.6 pg — ABNORMAL LOW (ref 26.0–34.0)
MCHC: 31.3 g/dL (ref 30.0–36.0)
MCV: 75.3 fL — ABNORMAL LOW (ref 80.0–100.0)
Platelets: 182 10*3/uL (ref 150–400)
RBC: 3.56 MIL/uL — ABNORMAL LOW (ref 3.87–5.11)
RDW: 15.5 % (ref 11.5–15.5)
WBC: 7.4 10*3/uL (ref 4.0–10.5)
nRBC: 0 % (ref 0.0–0.2)

## 2022-09-24 LAB — CULTURE, BETA STREP (GROUP B ONLY)

## 2022-09-24 LAB — MAGNESIUM: Magnesium: 5.8 mg/dL — ABNORMAL HIGH (ref 1.7–2.4)

## 2022-09-24 MED ORDER — ONDANSETRON HCL 4 MG/2ML IJ SOLN: 4.0000 mg | INTRAMUSCULAR | Status: DC | PRN

## 2022-09-24 MED ORDER — BISACODYL 10 MG RE SUPP: 10.0000 mg | Freq: Every day | RECTAL | Status: DC | PRN

## 2022-09-24 MED ORDER — DIPHENHYDRAMINE HCL 25 MG PO CAPS: 25.0000 mg | ORAL_CAPSULE | Freq: Four times a day (QID) | ORAL | Status: DC | PRN

## 2022-09-24 MED ORDER — BENZOCAINE-MENTHOL 20-0.5 % EX AERO
1.0000 | INHALATION_SPRAY | CUTANEOUS | Status: DC | PRN
  Administered 2022-09-24: 1 via TOPICAL
  Filled 2022-09-24: qty 56

## 2022-09-24 MED ORDER — FERROUS SULFATE 325 (65 FE) MG PO TABS
325.0000 mg | ORAL_TABLET | Freq: Every day | ORAL | Status: DC
  Administered 2022-09-24 – 2022-09-26 (×3): 325 mg via ORAL
  Filled 2022-09-24 (×3): qty 1

## 2022-09-24 MED ORDER — OXYCODONE HCL 5 MG PO TABS: 5.0000 mg | ORAL_TABLET | ORAL | Status: DC | PRN

## 2022-09-24 MED ORDER — ACETAMINOPHEN 325 MG PO TABS
650.0000 mg | ORAL_TABLET | ORAL | Status: DC | PRN
  Administered 2022-09-24 – 2022-09-26 (×5): 650 mg via ORAL
  Filled 2022-09-24 (×5): qty 2

## 2022-09-24 MED ORDER — COCONUT OIL OIL
1.0000 | TOPICAL_OIL | Status: DC | PRN
  Administered 2022-09-24: 1 via TOPICAL

## 2022-09-24 MED ORDER — DOCUSATE SODIUM 100 MG PO CAPS
100.0000 mg | ORAL_CAPSULE | Freq: Two times a day (BID) | ORAL | Status: DC
  Administered 2022-09-24 – 2022-09-26 (×6): 100 mg via ORAL
  Filled 2022-09-24 (×6): qty 1

## 2022-09-24 MED ORDER — DIBUCAINE (PERIANAL) 1 % EX OINT: 1.0000 | TOPICAL_OINTMENT | CUTANEOUS | Status: DC | PRN

## 2022-09-24 MED ORDER — CALCIUM GLUCONATE-NACL 2-0.675 GM/100ML-% IV SOLN
2.0000 g | Freq: Once | INTRAVENOUS | Status: AC
  Administered 2022-09-24: 2000 mg via INTRAVENOUS
  Filled 2022-09-24: qty 100

## 2022-09-24 MED ORDER — WITCH HAZEL-GLYCERIN EX PADS: 1.0000 | MEDICATED_PAD | CUTANEOUS | Status: DC | PRN

## 2022-09-24 MED ORDER — TETANUS-DIPHTH-ACELL PERTUSSIS 5-2.5-18.5 LF-MCG/0.5 IM SUSY: 0.5000 mL | PREFILLED_SYRINGE | Freq: Once | INTRAMUSCULAR | Status: DC

## 2022-09-24 MED ORDER — PRENATAL MULTIVITAMIN CH
1.0000 | ORAL_TABLET | Freq: Every day | ORAL | Status: DC
  Administered 2022-09-24 – 2022-09-26 (×3): 1 via ORAL
  Filled 2022-09-24 (×3): qty 1

## 2022-09-24 MED ORDER — FLEET ENEMA 7-19 GM/118ML RE ENEM: 1.0000 | ENEMA | Freq: Every day | RECTAL | Status: DC | PRN

## 2022-09-24 MED ORDER — IBUPROFEN 600 MG PO TABS
600.0000 mg | ORAL_TABLET | Freq: Four times a day (QID) | ORAL | Status: DC
  Administered 2022-09-24 – 2022-09-26 (×11): 600 mg via ORAL
  Filled 2022-09-24 (×11): qty 1

## 2022-09-24 MED ORDER — ONDANSETRON HCL 4 MG PO TABS: 4.0000 mg | ORAL_TABLET | ORAL | Status: DC | PRN

## 2022-09-24 MED ORDER — SIMETHICONE 80 MG PO CHEW: 80.0000 mg | CHEWABLE_TABLET | ORAL | Status: DC | PRN

## 2022-09-24 MED ORDER — OXYCODONE HCL 5 MG PO TABS: 10.0000 mg | ORAL_TABLET | ORAL | Status: DC | PRN

## 2022-09-24 NOTE — Progress Notes (Signed)
Pt has a critical Calcium level of 6.2 this morning. Md made aware.

## 2022-09-24 NOTE — Anesthesia Postprocedure Evaluation (Signed)
Anesthesia Post Note  Patient: Tina Dickson  Procedure(s) Performed: AN AD HOC LABOR EPIDURAL     Patient location during evaluation: OB High Risk Anesthesia Type: Epidural Level of consciousness: awake and alert Pain management: pain level controlled Vital Signs Assessment: post-procedure vital signs reviewed and stable Respiratory status: spontaneous breathing, nonlabored ventilation and respiratory function stable Cardiovascular status: stable Postop Assessment: no headache, no backache, epidural receding, no apparent nausea or vomiting, patient able to bend at knees, able to ambulate and adequate PO intake Anesthetic complications: no   No notable events documented.  Last Vitals:  Vitals:   09/24/22 0530 09/24/22 0724  BP:  112/60  Pulse:  90  Resp: 17 16  Temp:  36.6 C  SpO2:  98%    Last Pain:  Vitals:   09/24/22 0724  TempSrc: Oral  PainSc:    Pain Goal: Patients Stated Pain Goal: 3 (09/24/22 0419)                 Laban Emperor

## 2022-09-24 NOTE — Progress Notes (Signed)
Post Partum Day 1 Subjective: Patient is doing well this morning. Still feeling a little fatigued on the mag but much better since delivery. No headache, vision changes, RUQ pain. Pain controlled. Tolerating PO. Minimal lochia. Pumping.   Objective: Patient Vitals for the past 24 hrs:  BP Temp Temp src Pulse Resp SpO2  09/24/22 0724 112/60 97.8 F (36.6 C) Oral 90 16 98 %  09/24/22 0530 -- -- -- -- 17 --  09/24/22 0430 -- -- -- -- 18 --  09/24/22 0330 -- -- -- -- 18 --  09/24/22 0304 (!) 101/51 97.9 F (36.6 C) Oral 84 17 99 %  09/24/22 0133 115/64 98 F (36.7 C) Oral 91 18 --  09/24/22 0130 -- -- -- -- 18 --  09/24/22 0020 130/71 98 F (36.7 C) Oral 88 -- --  09/24/22 0000 118/73 -- -- 81 -- --  09/23/22 2345 125/67 -- -- 81 -- --  09/23/22 2315 138/74 -- -- 87 -- --  09/23/22 2300 (!) 130/107 -- -- 93 -- --  09/23/22 2245 126/85 98 F (36.7 C) Oral 79 17 --  09/23/22 2230 116/74 -- -- (!) 102 -- 93 %  09/23/22 2130 (!) 109/55 98.5 F (36.9 C) Oral 76 -- 100 %  09/23/22 2119 (!) 93/46 -- -- 73 -- 96 %  09/23/22 2100 (!) 109/56 -- -- 91 -- 97 %  09/23/22 2030 (!) 96/42 -- -- 81 -- 94 %  09/23/22 2000 (!) 106/28 -- -- 83 -- 100 %  09/23/22 1930 123/71 98.2 F (36.8 C) Oral 87 18 --  09/23/22 1900 118/73 -- -- 87 -- 99 %  09/23/22 1845 -- -- -- -- -- 100 %  09/23/22 1844 -- 98 F (36.7 C) Oral -- -- --  09/23/22 1839 -- -- -- -- -- 100 %  09/23/22 1838 -- -- -- -- -- 100 %  09/23/22 1830 120/78 -- -- 98 -- 100 %  09/23/22 1804 -- -- -- -- -- 100 %  09/23/22 1803 103/79 -- -- (!) 125 -- 100 %  09/23/22 1752 -- -- -- -- -- 100 %  09/23/22 1730 109/73 -- -- 94 -- 100 %  09/23/22 1721 -- -- -- -- -- 100 %  09/23/22 1711 -- 98 F (36.7 C) Oral -- 18 100 %  09/23/22 1707 -- -- -- -- -- 100 %  09/23/22 1703 106/66 -- -- 98 -- 100 %  09/23/22 1655 -- -- -- -- -- 100 %  09/23/22 1640 -- -- -- -- -- 100 %  09/23/22 1630 125/72 -- -- 90 -- 100 %  09/23/22 1615 -- -- -- -- 17  100 %  09/23/22 1607 -- -- -- -- -- 100 %  09/23/22 1600 125/83 -- -- 90 -- 100 %  09/23/22 1530 119/78 -- -- 89 -- 100 %  09/23/22 1516 -- 97.9 F (36.6 C) Oral -- 16 100 %  09/23/22 1513 -- -- -- -- -- 100 %  09/23/22 1506 -- -- -- -- -- 100 %  09/23/22 1500 110/66 -- -- 98 -- 98 %  09/23/22 1430 110/62 -- -- 79 16 99 %  09/23/22 1423 -- -- -- -- -- 100 %  09/23/22 1400 110/74 -- -- 86 -- 100 %  09/23/22 1339 -- -- -- -- 18 100 %  09/23/22 1330 119/74 -- -- 93 -- 100 %  09/23/22 1320 -- -- -- -- -- 100 %  09/23/22 1316 -- 97.7 F (36.5 C)  Oral -- -- 99 %  09/23/22 1300 116/66 -- -- 95 -- 100 %  09/23/22 1245 -- -- -- -- -- 99 %  09/23/22 1230 117/65 -- -- 85 -- 100 %  09/23/22 1208 -- -- -- -- 17 100 %  09/23/22 1207 -- -- -- -- -- 100 %  09/23/22 1203 132/81 -- -- (!) 265 -- 100 %  09/23/22 1130 (!) 114/97 -- -- -- -- 100 %  09/23/22 1121 -- -- -- -- 18 100 %  09/23/22 1116 -- -- -- -- -- 100 %  09/23/22 1100 111/66 -- -- 92 -- 100 %  09/23/22 1030 (!) 123/54 -- -- 87 -- 100 %  09/23/22 1028 -- 97.7 F (36.5 C) Oral -- 18 100 %  09/23/22 1020 -- -- -- -- -- 97 %  09/23/22 1000 121/70 -- -- 91 -- 99 %  09/23/22 0948 -- -- -- -- -- 98 %  09/23/22 0930 120/69 -- -- 88 -- 100 %  09/23/22 0924 -- -- -- -- -- 100 %  09/23/22 0900 119/69 -- -- 90 16 98 %    Physical Exam:  General: alert, cooperative, and no distress Lochia: appropriate Uterine Fundus: firm DVT Evaluation: No evidence of DVT seen on physical exam.  Recent Labs    09/22/22 1825 09/23/22 0742 09/23/22 1940 09/24/22 0527  WBC 6.5 8.4 6.8 7.4  HGB 10.3* 10.0* 9.5* 8.4*  HCT 32.7* 31.9* 30.4* 26.8*  PLT 201 187 163 182    Recent Labs    09/22/22 1217 09/23/22 0742 09/23/22 1940 09/24/22 0527  NA 135 135 135 134*  K 4.1 3.9 3.4* 3.6  CL 109 106 108 108  BUN <5* <5* <5* <5*  CREATININE 0.64 0.70 0.68 0.80  GLUCOSE 141* 129* 113* 120*  BILITOT 0.2* 0.3 0.5 0.1*  ALT 19 45* 131* 128*  AST 18  35 92* 72*  ALKPHOS 101 107 100 83  PROT 6.2* 6.3* 6.0* 5.5*  ALBUMIN 2.6* 2.6* 2.5* 2.2*    Recent Labs    09/22/22 1217 09/22/22 2042 09/23/22 0742 09/23/22 1940 09/24/22 0527  CALCIUM 7.4*  --  6.7* 6.3* 6.2*  MG  --  5.5*  --  5.8*  --     No results for input(s): "PROTIME", "APTT", "INR" in the last 72 hours.  No results for input(s): "PROTIME", "APTT", "INR", "FIBRINOGEN" in the last 72 hours. Assessment/Plan: NAVAEH KEHRES 26 y.o. [id number] PPD#1 sp SVD at 34.1 1. Severe preeclampsia: continue IV magnesium until 24 hours postpartum (d/c at 2154 this evening). Liver enzymes downtrending this AM. Will hold Nifedipine XL 30mg  due to low/normal Bps this AM, will restart if needed.  2. Hypocalcemia: repleted with 1g calcium gluconate yesterday without improvement. Spoke with [facility] this AM, corrected calcium is 7.4 due to low albumin. Recommend 2g calcium gluconate today and should continue to correct now that she is delivered and with stopping mag this evening. She is asymptomatic. 3. Rh pos 4. ABLA: EBL 553 at delivery, hgb 8.4 this AM, PO iron ordered.     LOS: 2 days   Charlett Nose 09/24/2022, 8:51 AM

## 2022-09-24 NOTE — Lactation Note (Signed)
This note was copied from a baby's chart.  NICU Lactation Consultation Note  Patient Name: Tina Dickson [id number] Date: 09/24/2022 Age:27 hours  Reason for consult: Initial assessment; NICU baby; Late-preterm 34-36.6wks; Infant < 6lbs  SUBJECTIVE Visited with family of 37 hours old LPI NICU female; Tina Dickson is a P3 but not very experienced breastfeeding. She had breastfeeding difficulties in the past and would like to give breastfeeding another try with this baby. Her plan is to do both, direct breastfeeding along with pumping and bottle feeding. Assisted with breast massage and hand expression and she was able to get small amounts of colostrum, praised her for her efforts. Reviewed pumping schedule, lactogenesis II and anticipatory guidelines.   OBJECTIVE Infant data: Mother's Current Feeding Choice: Breast Milk and Formula  Infant feeding assessment No data recorded  Maternal data: [id number]  Vaginal, Spontaneous Has patient been taught Hand Expression?: Yes Hand Expression Comments: small droplets noted Significant Breast History:: (+) breast changes during the pregnancy Current breast feeding challenges:: NICU admission Previous breastfeeding challenges?: Other (Comment) (Retained placenta) Does the patient have breastfeeding experience prior to this delivery?: Yes How long did the patient breastfeed?: 2 weeks Pumping frequency: Initiated pumping at 10 hours post-partum Pumped volume: 2 mL Flange Size: 21 Risk factor for low milk supply:: prematurity, infant separation, PPH of 535 cc  Pump: Personal (Lansinoh, Spectra S2, Twisten DEBP)  ASSESSMENT Infant: Feeding Status: Scheduled 8-11-2-5  Maternal: Milk volume: Normal  INTERVENTIONS/PLAN Interventions: Interventions: Breast feeding basics reviewed; Breast massage; Hand express; Coconut oil; DEBP; Education; [employer] brochure Tools: Pump; Flanges; Coconut oil; Hands-free pumping top (Size XL) Pump Education:  Setup, frequency, and cleaning; Milk Storage  Plan: Encouraged pumping every 3 hours, ideally 8 pumping sessions/24 hours Breast massage, hand expression and coconut oil were also encouraged prior pumping  FOB present and supportive. All questions and concerns answered, family to contact [facility] services PRN.  Consult Status: NICU follow-up NICU Follow-up type: New admission follow up   Tina Dickson 09/24/2022, 3:56 PM

## 2022-09-25 LAB — COMPREHENSIVE METABOLIC PANEL
ALT: 83 U/L — ABNORMAL HIGH (ref 0–44)
AST: 27 U/L (ref 15–41)
Albumin: 2.1 g/dL — ABNORMAL LOW (ref 3.5–5.0)
Alkaline Phosphatase: 72 U/L (ref 38–126)
Anion gap: 6 (ref 5–15)
BUN: 6 mg/dL (ref 6–20)
CO2: 21 mmol/L — ABNORMAL LOW (ref 22–32)
Calcium: 7.1 mg/dL — ABNORMAL LOW (ref 8.9–10.3)
Chloride: 109 mmol/L (ref 98–111)
Creatinine, Ser: 0.66 mg/dL (ref 0.44–1.00)
GFR, Estimated: >60 mL/min (ref >60)
Glucose, Bld: 88 mg/dL (ref 70–99)
Potassium: 3.8 mmol/L (ref 3.5–5.1)
Sodium: 136 mmol/L (ref 135–145)
Total Bilirubin: <0.1 mg/dL — ABNORMAL LOW (ref 0.3–1.2)
Total Protein: 5.1 g/dL — ABNORMAL LOW (ref 6.5–8.1)

## 2022-09-25 MED ORDER — PANTOPRAZOLE SODIUM 40 MG PO TBEC
40.0000 mg | DELAYED_RELEASE_TABLET | Freq: Every day | ORAL | Status: DC
  Administered 2022-09-25 – 2022-09-26 (×2): 40 mg via ORAL
  Filled 2022-09-25 (×2): qty 1

## 2022-09-25 NOTE — Progress Notes (Addendum)
Patient ID: Tina Dickson, female   DOB: 26-Jun-1995, 27 y.o.   MRN: [id number] Pt seen. Resting in room. Has no complaints other than some tingling in her finger and toes. Denies HA, blurry vision, CP or SOB. Pain well controlled.  Baby doing well iin NICU.   VSS  GEN - NAD, ABD - FF  EXT - no homans   Ca- 7.1 ( corrected to 8.6) AST - 27 wnl ALT 83 trending down   Hg 8.4  A/P: 40JW [id number] female on PPD#2 s/p svd at 91 1/7wks  Severe preE - s/p magnesium sulfate x up to 24hrs post delivery ( stopped at 2154pm last night). LIvere enzymes improving, BP normal on no meds.  Hypocalcemia - improved. S/p repletion Rh pos  ABLA - oral iron   Possible discharge home tomorrow
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
## 2022-09-25 NOTE — Lactation Note (Signed)
This note was copied from a baby's chart.  NICU Lactation Consultation Note  Patient Name: Tina Dickson [id number] Date: 09/25/2022 Age:27 hours  Reason for consult: Follow-up assessment; NICU baby; Late-preterm 34-36.6wks; Infant < 6lbs  SUBJECTIVE Visited with family of 27 hours old LPI NICU female; Ms. Shumard is a P3 and reports she's been pumping every 3 hours, praised her for her efforts. She voiced that "nothing" comes out yet, explained that the purpose of pumping this early on is mainly for breast stimulation and not to get volume. Reviewed pumping schedule, lactogenesis II and anticipatory guidelines.   OBJECTIVE Infant data: Mother's Current Feeding Choice: Breast Milk and Formula  Infant feeding assessment Scale for Readiness: 3   Maternal data: [id number]  Vaginal, Spontaneous Has patient been taught Hand Expression?: Yes Hand Expression Comments: small droplets noted Significant Breast History:: (+) breast changes during the pregnancy Current breast feeding challenges:: NICU admission Previous breastfeeding challenges?: Other (Comment) (Retained placenta) Does the patient have breastfeeding experience prior to this delivery?: Yes How long did the patient breastfeed?: 2 weeks Pumping frequency: 7 times/24 hours Pumped volume: 0 mL (drops) Flange Size: 21 Risk factor for low milk supply:: prematurity, infant separation, PPH of 535 cc  Pump: Personal (Lansinoh, Spectra S2, Twisten DEBP)  ASSESSMENT Infant: Feeding Status: Scheduled 8-11-2-5  Maternal: Milk volume: Normal  INTERVENTIONS/PLAN Interventions: Interventions: Breast feeding basics reviewed; Coconut oil; DEBP; Education Tools: Pump; Flanges; Hands-free pumping top (Size XL) Pump Education: Setup, frequency, and cleaning; Milk Storage  Plan: Encouraged to continue pumping every 3 hours, ideally 8 pumping sessions/24 hours Breast massage, hand expression and coconut oil were also encouraged prior  pumping   FOB present and supportive. All questions and concerns answered, family to contact [facility] services PRN.  Consult Status: NICU follow-up NICU Follow-up type: Maternal D/C visit   Hadrian Yarbrough Venetia Constable 09/25/2022, 1:22 PM

## 2022-09-25 NOTE — Progress Notes (Signed)
Post Partum Day 2 Subjective: Pt not in room. Will try again later to see her or follow to NICU. Reviewed status with RN and reviewed chart  Objective: Blood pressure 114/60, pulse 70, temperature 98.1 F (36.7 C), temperature source Oral, resp. rate 18, height 5\' 3"  (1.6 m), weight 103.9 kg, SpO2 99 %, unknown if currently breastfeeding.  Physical Exam:  N/A  Recent Labs    09/23/22 1940 09/24/22 0527  HGB 9.5* 8.4*  HCT 30.4* 26.8*    Assessment/Plan - Follow up with pt later.  - Will need to continue to correct calcium - Monitor BP today till at least 24hrs post MgSo4 ( d/ced at 2134pm last night) - D/C home when stable    LOS: 3 days   Sam Overbeck W Dianah Pruett, DO 09/25/2022, 8:59 AM

## 2022-09-26 LAB — TYPE AND SCREEN
Antibody Screen: NEGATIVE
Unit division: 0
Unit division: 0

## 2022-09-26 LAB — BPAM RBC
Blood Product Expiration Date: 202407132359
Blood Product Expiration Date: 202407132359
Unit Type and Rh: 6200

## 2022-09-26 LAB — SURGICAL PATHOLOGY

## 2022-09-26 MED ORDER — IBUPROFEN 600 MG PO TABS: 600.0000 mg | ORAL_TABLET | Freq: Four times a day (QID) | ORAL | 0 refills | Status: DC | PRN

## 2022-09-26 NOTE — Discharge Summary (Signed)
Postpartum Discharge Summary       Patient Name: Tina Dickson DOB: 1995-06-04 MRN: [id number]  Date of admission: 09/21/2022 Delivery date:09/23/2022  Delivering provider: Derl Barrow E  Date of discharge: 09/26/2022  Admitting diagnosis: Preeclampsia [O14.90] Preeclampsia, severe, third trimester [O14.13] Intrauterine pregnancy: [redacted]w[redacted]d     Secondary diagnosis:  Principal Problem:   Preeclampsia Active Problems:   Preeclampsia, severe, third trimester     Discharge diagnosis: Preeclampsia (severe)                                              Post partum procedures: NA Augmentation: AROM, Pitocin, and Cytotec Complications: None  Hospital course: Induction of Labor With Vaginal Delivery   27 y.o. yo [id number] at [redacted]w[redacted]d was admitted to the hospital 09/21/2022 for induction of labor.  Indication for induction:  Sever Pre-Eclampsia .  Patient had an labor course complicated by nothing Membrane Rupture Time/Date: 11:18 AM ,09/23/2022   Delivery Method:Vaginal, Spontaneous  Episiotomy: None  Lacerations:  None  Details of delivery can be found in separate delivery note.  Patient had a postpartum course complicated by nothing. Patient is discharged home 09/26/22.  Newborn Data: Birth date:09/23/2022  Birth time:9:54 PM  Gender:Female  Living status:Living  Apgars:8 ,9  Weight:2020 g   Magnesium Sulfate received: Yes: Seizure prophylaxis BMZ received: No Rhophylac:N/A  Physical exam  Vitals:   09/25/22 2316 09/26/22 0417 09/26/22 0839 09/26/22 1150  BP: 130/63 (!) 112/54 138/69 139/76  Pulse: 75 (!) 54 63 64  Resp: 16 15 15 16   Temp: 97.8 F (36.6 C) 98 F (36.7 C) 99.2 F (37.3 C) 98.1 F (36.7 C)  TempSrc: Oral Oral Oral Oral  SpO2:  97% 97% 98%  Weight:      Height:       General: alert, cooperative, and no distress Lochia: appropriate Uterine Fundus: firm DVT Evaluation: No evidence of DVT seen on physical exam. Labs: Lab Results  Component  Value Date   WBC 7.4 09/24/2022   HGB 8.4 (L) 09/24/2022   HCT 26.8 (L) 09/24/2022   MCV 75.3 (L) 09/24/2022   PLT 182 09/24/2022      Latest Ref Rng & Units 09/25/2022    4:38 AM  CMP  Glucose 70 - 99 mg/dL 88   BUN 6 - 20 mg/dL 6   Creatinine 1.47 - 8.29 mg/dL 5.62   Sodium 130 - 865 mmol/L 136   Potassium 3.5 - 5.1 mmol/L 3.8   Chloride 98 - 111 mmol/L 109   CO2 22 - 32 mmol/L 21   Calcium 8.9 - 10.3 mg/dL 7.1   Total Protein 6.5 - 8.1 g/dL 5.1   Total Bilirubin 0.3 - 1.2 mg/dL <7.8   Alkaline Phos 38 - 126 U/L 72   AST 15 - 41 U/L 27   ALT 0 - 44 U/L 83    Edinburgh Score:    09/24/2022    9:00 AM  Edinburgh Postnatal Depression Scale Screening Tool  I have been able to laugh and see the funny side of things. 0  I have looked forward with enjoyment to things. 0  I have blamed myself unnecessarily when things went wrong. 0  I have been anxious or worried for no good reason. 0  I have felt scared or panicky for no good reason. 0  Things have been  getting on top of me. 0  I have been so unhappy that I have had difficulty sleeping. 0  I have felt sad or miserable. 0  I have been so unhappy that I have been crying. 0  The thought of harming myself has occurred to me. 0  Edinburgh Postnatal Depression Scale Total 0      After visit meds:      Discharge home in stable condition Infant Feeding: Bottle and Breast Infant Disposition:NICU Discharge instruction: per After Visit Summary and Postpartum booklet. Activity: Advance as tolerated. Pelvic rest for 6 weeks.  Diet: routine diet Anticipated Birth Control: Unsure Postpartum Appointment:1 week Future Appointments:No future appointments. Follow up Visit:  Follow-up Information     [facility] Follow up in 1 week(s).   Why: For a blood pressure check Contact information: [address]  [address] [place] [address] [phone]                     09/26/2022 Waynard Reeds,  MD

## 2022-09-26 NOTE — Plan of Care (Signed)
  Problem: Education: Goal: Knowledge of disease or condition will improve Outcome: Adequate for Discharge Goal: Knowledge of the prescribed therapeutic regimen will improve Outcome: Adequate for Discharge Goal: Individualized Educational Video(s) Outcome: Adequate for Discharge   Problem: Clinical Measurements: Goal: Complications related to the disease process, condition or treatment will be avoided or minimized Outcome: Adequate for Discharge   Problem: Education: Goal: Knowledge of General Education information will improve Description: Including pain rating scale, medication(s)/side effects and non-pharmacologic comfort measures Outcome: Adequate for Discharge   Problem: Health Behavior/Discharge Planning: Goal: Ability to manage health-related needs will improve Outcome: Adequate for Discharge   Problem: Clinical Measurements: Goal: Ability to maintain clinical measurements within normal limits will improve Outcome: Adequate for Discharge Goal: Will remain free from infection Outcome: Adequate for Discharge Goal: Diagnostic test results will improve Outcome: Adequate for Discharge Goal: Respiratory complications will improve Outcome: Adequate for Discharge Goal: Cardiovascular complication will be avoided Outcome: Adequate for Discharge   Problem: Activity: Goal: Risk for activity intolerance will decrease Outcome: Adequate for Discharge   Problem: Nutrition: Goal: Adequate nutrition will be maintained Outcome: Adequate for Discharge   Problem: Coping: Goal: Level of anxiety will decrease Outcome: Adequate for Discharge   Problem: Elimination: Goal: Will not experience complications related to bowel motility Outcome: Adequate for Discharge Goal: Will not experience complications related to urinary retention Outcome: Adequate for Discharge   Problem: Pain Managment: Goal: General experience of comfort will improve Outcome: Adequate for Discharge   Problem:  Safety: Goal: Ability to remain free from injury will improve Outcome: Adequate for Discharge   Problem: Skin Integrity: Goal: Risk for impaired skin integrity will decrease Outcome: Adequate for Discharge   Problem: Education: Goal: Knowledge of disease or condition will improve Outcome: Adequate for Discharge Goal: Knowledge of the prescribed therapeutic regimen will improve Outcome: Adequate for Discharge   Problem: Fluid Volume: Goal: Peripheral tissue perfusion will improve Outcome: Adequate for Discharge   Problem: Clinical Measurements: Goal: Complications related to disease process, condition or treatment will be avoided or minimized Outcome: Adequate for Discharge   Problem: Education: Goal: Knowledge of condition will improve Outcome: Adequate for Discharge Goal: Individualized Educational Video(s) Outcome: Adequate for Discharge Goal: Individualized Newborn Educational Video(s) Outcome: Adequate for Discharge   Problem: Activity: Goal: Will verbalize the importance of balancing activity with adequate rest periods Outcome: Adequate for Discharge Goal: Ability to tolerate increased activity will improve Outcome: Adequate for Discharge   Problem: Coping: Goal: Ability to identify and utilize available resources and services will improve Outcome: Adequate for Discharge   Problem: Life Cycle: Goal: Chance of risk for complications during the postpartum period will decrease Outcome: Adequate for Discharge   Problem: Role Relationship: Goal: Ability to demonstrate positive interaction with newborn will improve Outcome: Adequate for Discharge   Problem: Skin Integrity: Goal: Demonstration of wound healing without infection will improve Outcome: Adequate for Discharge
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
## 2022-09-26 NOTE — Lactation Note (Signed)
This note was copied from a baby's chart.  NICU Lactation Consultation Note  Patient Name: Girl Jaedin Trumbo [id number] Date: 09/26/2022 Age:27 hours  Reason for consult: Follow-up assessment; NICU baby; 1st time breastfeeding; Late-preterm 34-36.6wks; Infant < 6lbs  SUBJECTIVE  LC in to visit with P3 Mom of LPTI delivered vaginally.  Baby admitted to NICU for prematurity and respiratory support.  Baby is currently on room air and receiving enteral feedings of 24 cal formula.    Mom has been consistently pumping and has 10 ml EBM in refrigerator.  Mom is very committed to providing her EBM to baby.  Mom is also open to direct breast feeding and understands that the first step to this is STS with baby, along with consistent pumping to support a full milk supply.  Engorgement prevention and treatment reviewed.  Mom heading to NICU with 2 bottles of EBM.  Mom has labels for bottles.  OBJECTIVE Infant data: Mother's Current Feeding Choice: Breast Milk and Formula  Infant feeding assessment Scale for Readiness: 3   Maternal data: [id number]  Vaginal, Spontaneous Pumping frequency: 8 times per 24 hrs Pumped volume: 10 mL Flange Size: 21  Pump: Personal (Lansinoh, Spectra S2, Twisten DEBP)  ASSESSMENT Infant: Feeding Status: Scheduled 8-11-2-5  Maternal: Milk volume: Normal  INTERVENTIONS/PLAN Interventions: Interventions: Breast feeding basics reviewed; Skin to skin; Breast massage; Hand express; DEBP; Education Discharge Education: Engorgement and breast care Tools: Pump Pump Education: Setup, frequency, and cleaning; Milk Storage  Plan: Consult Status: NICU follow-up NICU Follow-up type: Verify onset of copious milk; Verify absence of engorgement   Judee Clara 09/26/2022, 10:20 AM

## 2022-09-26 NOTE — Plan of Care (Signed)
Patient given discharge instructions: PP care reviewed, PP HTN, F/U and meds reviewed with patient. Patient verbalizes understanding.

## 2022-09-29 ENCOUNTER — Ambulatory Visit (HOSPITAL_COMMUNITY): Payer: Self-pay

## 2022-09-29 NOTE — Lactation Note (Signed)
This note was copied from a baby's chart.  NICU Lactation Consultation Note  Patient Name: Tina Dickson [id number] Date: 09/29/2022 Age:27 years  Reason for consult: Mother's request; Follow-up assessment; NICU baby; Late-preterm 34-36.6wks; Infant < 6lbs  SUBJECTIVE NICU RN Dannette Barbara. Called this LC due to maternal request; Ms. Luy had a question regarding her supply and output from both breasts. She voiced she's getting about 2 oz from her R side and only 1.5 oz from her L side. Explained normalcy patterns with pumping and supply; she voiced that after pumping her breast feel soft and "relieved" (see maternal assessment).  Reviewed pumping schedule, lactogenesis III and anticipatory guidelines.   OBJECTIVE Infant data: Mother's Current Feeding Choice: Breast Milk and Formula  Infant feeding assessment Scale for Readiness: 3 Scale for Quality: 4   Maternal data: [id number]  Vaginal, Spontaneous Pumping frequency: 8 times/24 hours Pumped volume: 90 mL (90-100 ml) Flange Size: 21  Pump: Personal (Lansinoh, Spectra S2, Twisten DEBP)  ASSESSMENT Infant: Feeding Status: Scheduled 8-11-2-5  Maternal: Milk volume: Normal No S/S of engorgement at this time; noticed R side is slightly bigger than her L side, reviewed normalcy patterns for breast asymmetry   INTERVENTIONS/PLAN Interventions: Interventions: Breast feeding basics reviewed; Coconut oil; DEBP; Education Tools: Pump; Flanges; Coconut oil Pump Education: Setup, frequency, and cleaning; Milk Storage  Plan: Encouraged to continue pumping every 3 hours, ideally 8 pumping sessions/24 hours Parents will start working on some pre-feeding activities such as pacifier dips and holding baby during gavage feedings until she's ready to go to breast   FOB present and supportive. All questions and concerns answered, family to contact [facility] services PRN.  Consult Status: NICU follow-up NICU Follow-up type: Weekly NICU follow  up   Lenzi Marmo Venetia Constable 09/29/2022, 5:58 PM

## 2022-10-07 ENCOUNTER — Ambulatory Visit (HOSPITAL_COMMUNITY): Payer: Self-pay

## 2022-10-07 NOTE — Lactation Note (Signed)
This note was copied from a baby's chart.  NICU Lactation Consultation Note  Patient Name: Tina Dickson ZOXWR'U Date: 10/07/2022 Age:27 years old  Reason for consult: Weekly NICU follow-up; NICU baby; Late-preterm 34-36.6wks; Infant < 6lbs  SUBJECTIVE Visited with family of 80 95/43 weeks old AGA NICU female; Ms. Zipay is a P3 and reports she continues pumping consistently and her supply remains steady, praised her for efforts. She noticed that she gets more milk with the hospital grade pump than her Spectra at home. Advised to pump in baby's room whenever she gets the chance. She tried putting baby "Tina Dickson" to breast today but she didn't latch; she voiced she does better with the bottle. Asked her to call for latch assistance when needed. Reviewed pumping schedule, strategies to protect supply and anticipatory guidelines.   OBJECTIVE Infant data: Mother's Current Feeding Choice: Breast Milk  Infant feeding assessment Scale for Readiness: 2 Scale for Quality: 2   Maternal data: [id number]  Vaginal, Spontaneous Pumping frequency: 7 times/24 hours Pumped volume: 100 mL (more in the AM) Flange Size: 21  Pump: Personal (Lansinoh, Spectra S2, Twisten DEBP)  ASSESSMENT Infant: Feeding Status: Scheduled 8-11-2-5  Maternal: Milk volume: Normal  INTERVENTIONS/PLAN Interventions: Interventions: Breast feeding basics reviewed; DEBP; Coconut oil; Education Tools: Pump; Flanges; Coconut oil Pump Education: Setup, frequency, and cleaning; Milk Storage  Plan: Encouraged to continue pumping every 3 hours, ideally 7-8 pumping sessions/24 hours She'll start power pumping in the AM if she skips/prolongs a pumping session at night She'll call for feeding assist the next time she's ready to take to breast    No other support person at this time. All questions and concerns answered, family to contact [facility] services PRN.  Consult Status: NICU follow-up NICU Follow-up type: Weekly NICU follow  up   Shay Bartoli S Philis Nettle 10/07/2022, 11:25 AM

## 2022-10-12 ENCOUNTER — Ambulatory Visit (HOSPITAL_COMMUNITY): Payer: Self-pay

## 2022-10-12 NOTE — Lactation Note (Signed)
This note was copied from a baby's chart.  NICU Lactation Consultation Note  Patient Name: Tina Dickson ZOXWR'U Date: 10/12/2022 Age:27 wk.o.  Reason for consult: Weekly NICU follow-up; NICU baby; Late-preterm 34-36.6wks; Infant < 6lbs  SUBJECTIVE Visited with family of 69 98/78 weeks old AGA NICU female; Tina Dickson is a P3 and reports that after power pumping she's noticing an increased on her supply, praised her for her efforts. She has also been tasking baby "Tina Dickson" to breast and that's her plan going home, doing some breastfeeding along with pumping and bottle feeding. Baby "Tina Dickson" is going home today. Reviewed discharge education and the importance of continuing pumping after feedings/attempts at the breast to protect her supply. Referral to [facility] sent per her request. No other support person at this time, all questions and concerns answered, family to contact [facility] services PRN.   OBJECTIVE Infant data: Mother's Current Feeding Choice: Breast Milk  Infant feeding assessment Scale for Readiness: 1 Scale for Quality: 1   Maternal data: [id number] Vaginal, Spontaneous Pumping frequency: 6-7 times/24 hours but she's also putting baby to breast Pumped volume: 130 mL Flange Size: 21  Pump: Personal (Lansinoh, Spectra S2, Twisten DEBP)  ASSESSMENT Infant: Feeding Status: Ad lib  Maternal: Milk volume: Normal  INTERVENTIONS/PLAN Interventions: Interventions: Breast feeding basics reviewed; DEBP; Education Discharge Education: Outpatient recommendation; Outpatient Epic message sent Tools: Pump; Flanges Pump Education: Setup, frequency, and cleaning; Milk Storage  Plan: Consult Status: Complete   Rye Decoste S Radames Mejorado 10/12/2022, 10:47 AM

## 2022-10-17 ENCOUNTER — Telehealth (HOSPITAL_COMMUNITY): Payer: Self-pay

## 2022-10-17 NOTE — Telephone Encounter (Signed)
10/17/2022 1432  Name: Tina Dickson MRN: [id number] DOB: 06-01-1995  Reason for Call:  Transition of Care Hospital Discharge Call  Contact Status: Patient Contact Status: Complete  Language assistant needed: Interpreter Mode: Interpreter Not Needed        Follow-Up Questions: Do You Have Any Concerns About Your Health As You Heal From Delivery?: No Do You Have Any Concerns About Your Infants Health?: No  Edinburgh Postnatal Depression Scale:  In the Past 7 Days: I have been able to laugh and see the funny side of things.: As much as I always could I have looked forward with enjoyment to things.: As much as I ever did I have blamed myself unnecessarily when things went wrong.: No, never I have been anxious or worried for no good reason.: No, not at all I have felt scared or panicky for no good reason.: No, not at all Things have been getting on top of me.: No, I have been coping as well as ever I have been so unhappy that I have had difficulty sleeping.: Not at all I have felt sad or miserable.: No, not at all I have been so unhappy that I have been crying.: No, never The thought of harming myself has occurred to me.: Never Inocente Salles Postnatal Depression Scale Total: 0  PHQ2-9 Depression Scale:     Discharge Follow-up: Edinburgh score requires follow up?: No Patient was advised of the following resources:: Support Group, Breastfeeding Support Group Did patient express any COVID concerns?: No  Post-discharge interventions: Reviewed Newborn Safe Sleep Practices  Signature  Signe Colt

## 2022-12-19 IMAGING — US US PELVIS COMPLETE
1 series · 15 of 23 positions shown · non-contrast
Comparison: Obstetrical ultrasound 08/24/2020

CLINICAL DATA: Pelvic pain with increased vaginal bleeding for 1
day. Spontaneous vaginal delivery eight days prior.

EXAM:
TRANSABDOMINAL ULTRASOUND OF PELVIS
TECHNIQUE: Transabdominal ultrasound examination of the pelvis was performed
including evaluation of the uterus, ovaries, adnexal regions, and
pelvic cul-de-sac. Transvaginal imaging not performed due to recent
vaginal delivery.

[Series 1: us pelvis complete · 15 of 23 slices shown]
[im 1/23]
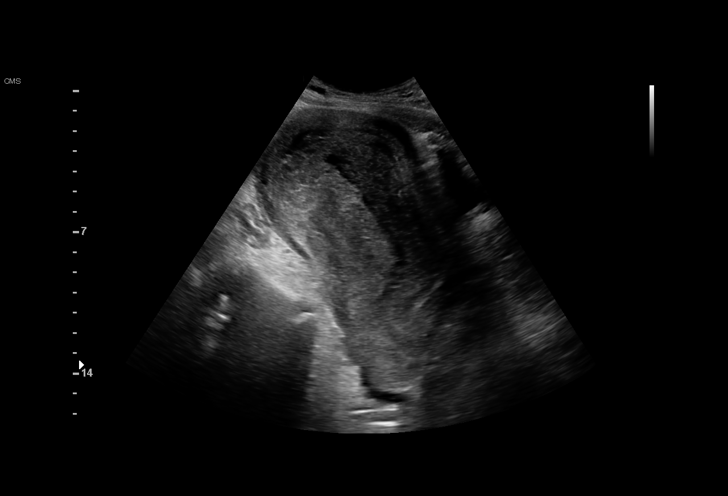
[im 3/23]
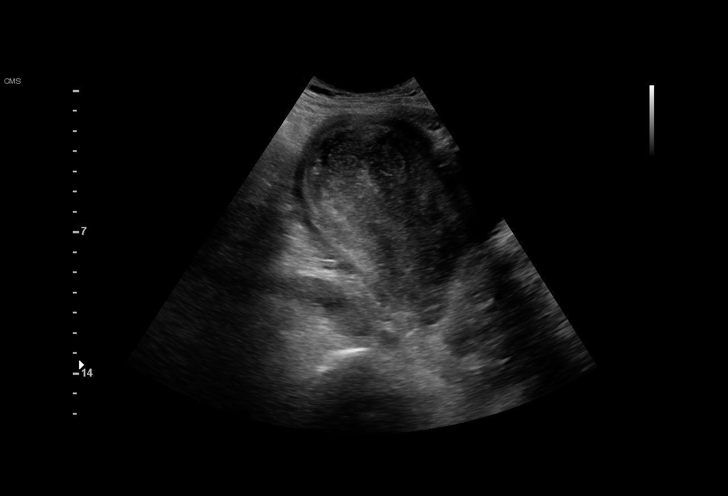
[im 4/23]
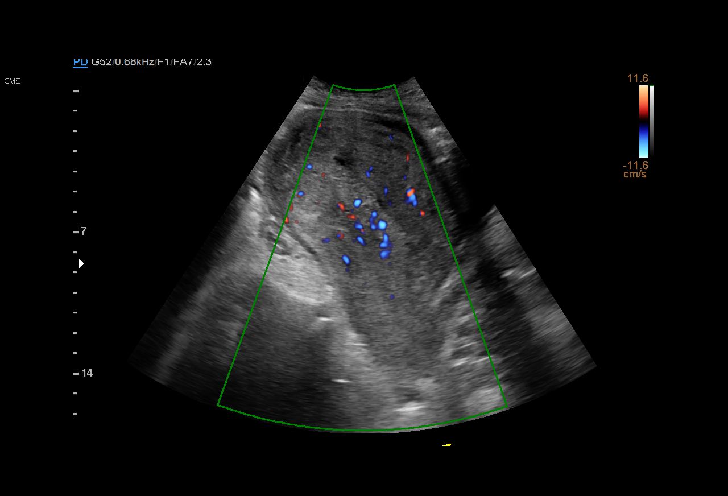
[im 6/23]
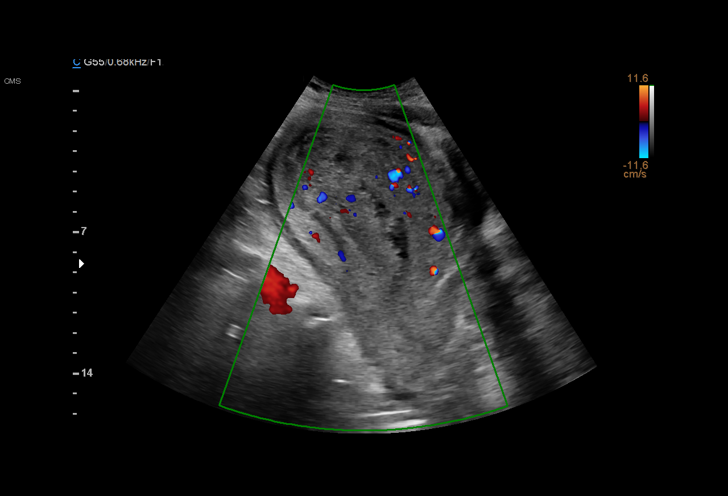
[im 7/23]
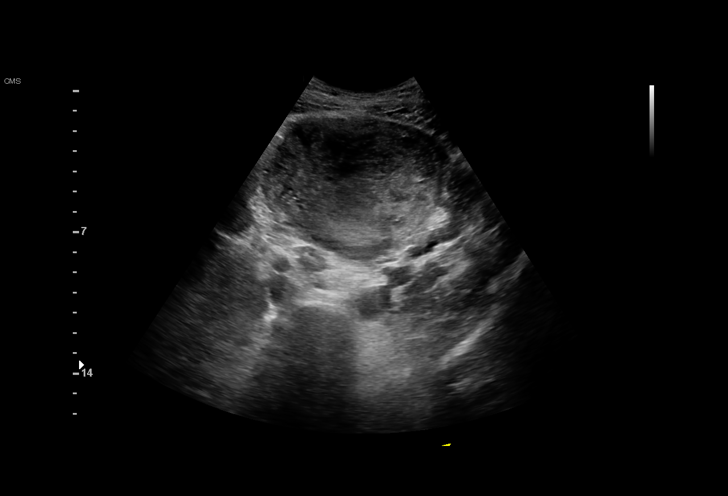
[im 9/23]
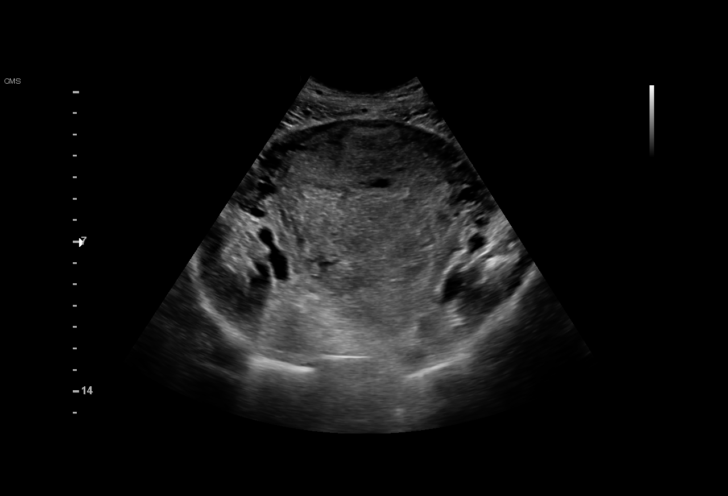
[im 10/23]
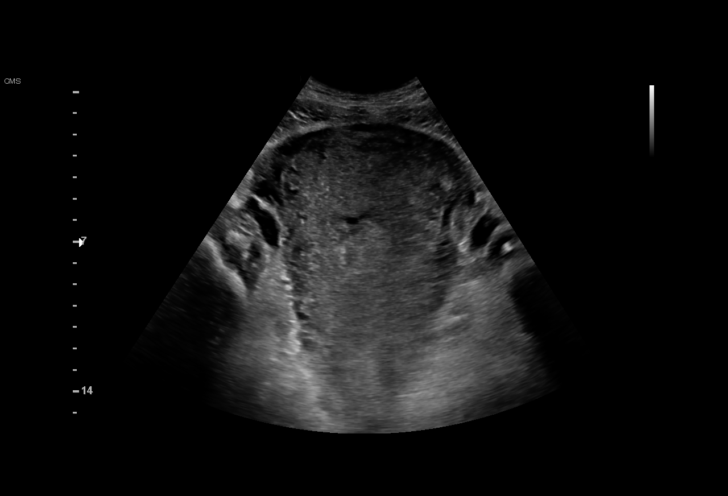
[im 12/23]
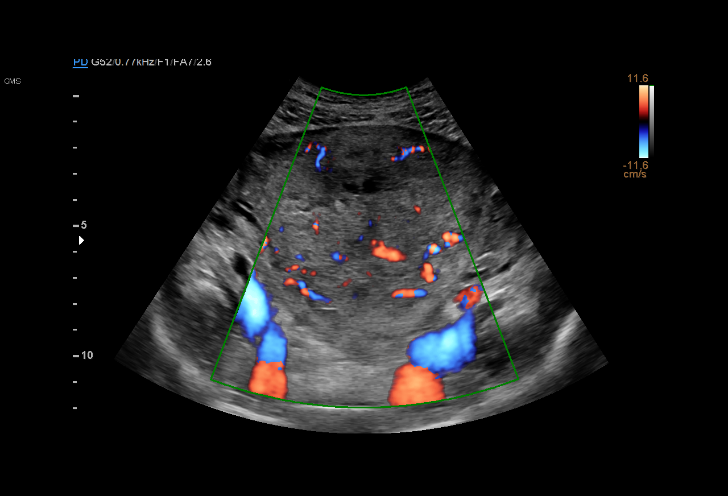
[im 14/23]
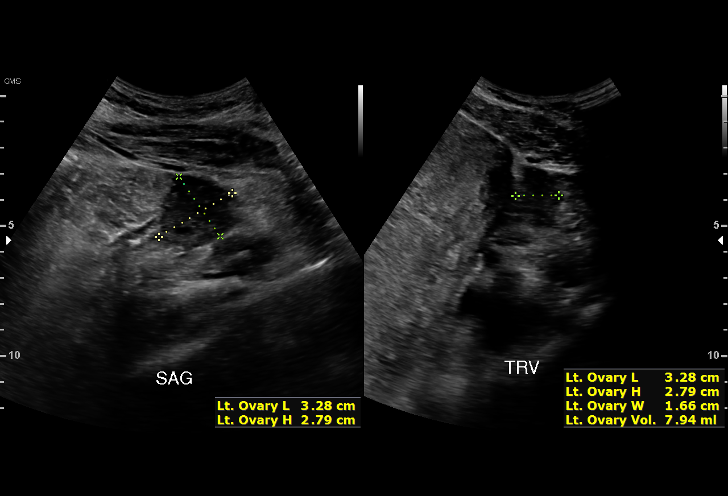
[im 15/23]
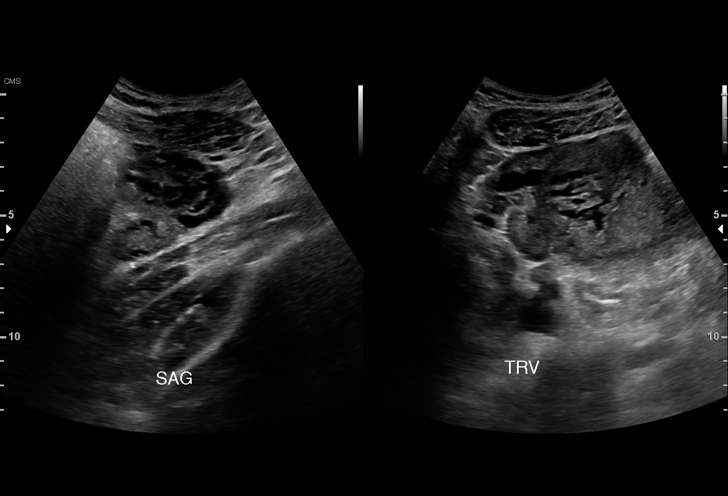
[im 17/23]
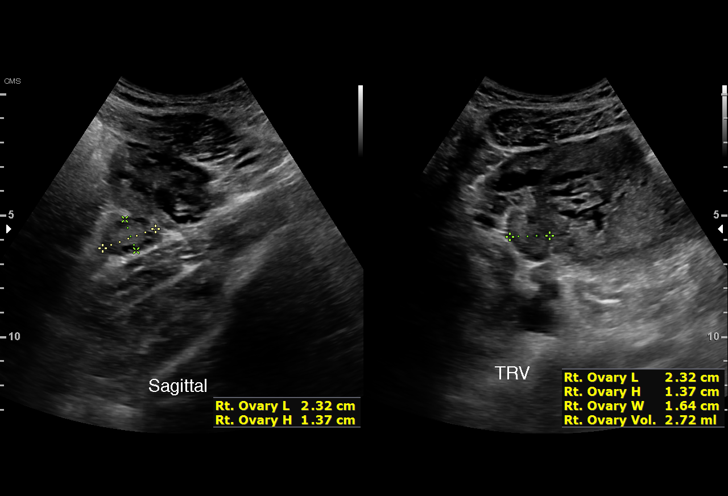
[im 18/23]
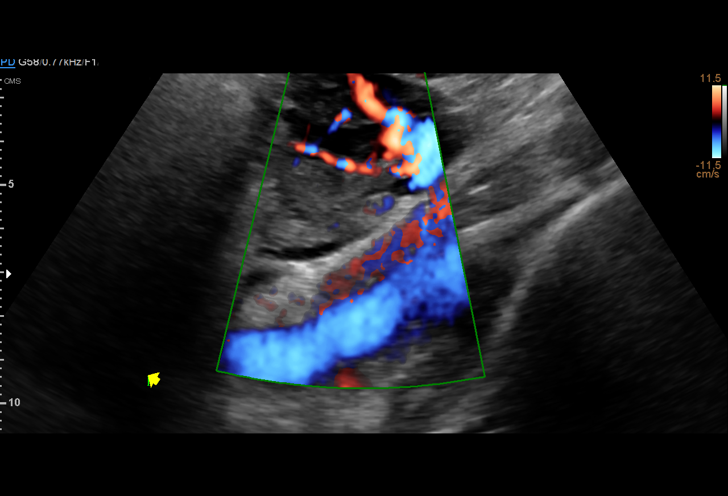
[im 20/23]
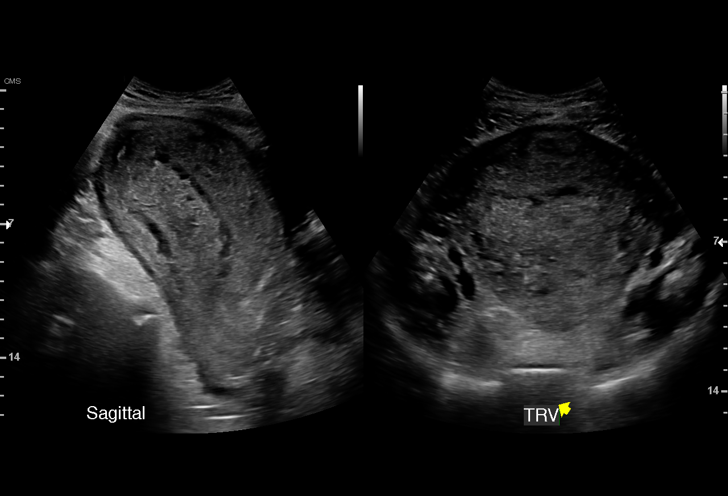
[im 21/23]
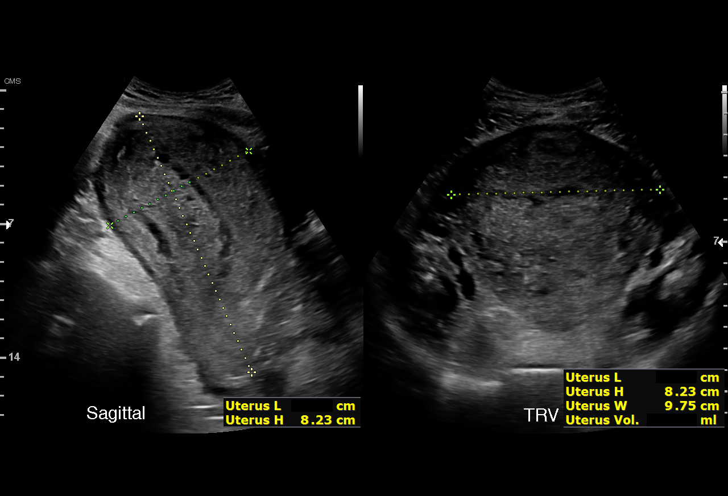
[im 23/23]
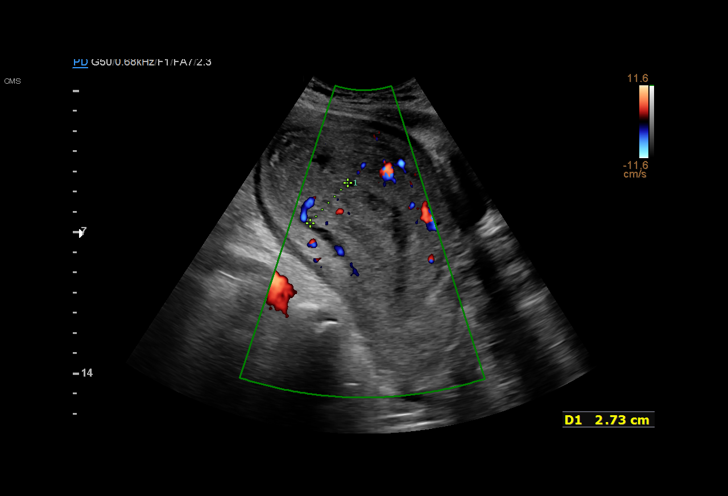

[15 of 23 positions shown; findings below may reference images not displayed]

FINDINGS: Uterus

Measurements: 14.6 x 8.2 x 9.8 cm = volume: 612.7 ML. No fibroids or
other mass visualized.

Endometrium

Thickness: 27 mm. There is asymmetric thickening with vascularity
posteriorly. No focal mass lesion.

Right ovary

Measurements: 2.3 x 1.4 x 1.6 cm = volume: 2.7 mL. Normal
appearance/no adnexal mass.

Left ovary

Measurements: 3.3 x 2.8 x 1.7 cm = volume: 7.9 mL. Normal
appearance/no adnexal mass.

Other findings:  No abnormal free fluid.
IMPRESSION: 1. Thickening of the endometrium with vascularity on Doppler
ultrasound suspicious for retained products of conception in the
appropriate clinical setting.
2. No adnexal mass.

## 2023-04-01 ENCOUNTER — Inpatient Hospital Stay (HOSPITAL_COMMUNITY)
Admission: AD | Admit: 2023-04-01 | Discharge: 2023-04-01 | Disposition: A | Discharge status: Home / Self Care | Payer: Medicaid Other | Attending: Student | Admitting: Student

## 2023-04-01 ENCOUNTER — Encounter (HOSPITAL_COMMUNITY): Payer: Self-pay | Admitting: *Deleted

## 2023-04-01 DIAGNOSIS — Z3202 Encounter for pregnancy test, result negative: Secondary | ICD-10-CM | POA: Diagnosis present

## 2023-04-01 DIAGNOSIS — R197 Diarrhea, unspecified: Secondary | ICD-10-CM | POA: Diagnosis not present

## 2023-04-01 DIAGNOSIS — R112 Nausea with vomiting, unspecified: Secondary | ICD-10-CM | POA: Diagnosis not present

## 2023-04-01 LAB — URINALYSIS, ROUTINE W REFLEX MICROSCOPIC
Bilirubin Urine: NEGATIVE
Glucose, UA: NEGATIVE mg/dL
Hgb urine dipstick: NEGATIVE
Ketones, ur: 80 mg/dL — AB
Nitrite: NEGATIVE
Protein, ur: 30 mg/dL — AB
Specific Gravity, Urine: 1.032 — ABNORMAL HIGH (ref 1.005–1.030)
pH: 5 (ref 5.0–8.0)

## 2023-04-01 LAB — POCT PREGNANCY, URINE: Preg Test, Ur: NEGATIVE

## 2023-04-01 NOTE — MAU Note (Signed)
Tina Dickson is a 27 y.o. at Unknown here in MAU reporting: believes she had a recent miscarriage (2-4 wks ago, she can't remember). Was supposed to the dr this past wk to confirm the miscarriage, was unable to go.  Has been throwing up since Wed. Denies fever. ? Had "slight diarrhea" (just a little bit comes out.   Is not currently bleeding. Last wk had some spotting.  A month or so ago, had heavier bleeding with clots. Preg confirmed at [facility] "about a month or so ago".  Was six wks when she had the [place]. EDC 09/09/23.  Onset of complaint: Wed Pain score: moderate Vitals:   04/01/23 1326  BP: 128/78  Pulse: 82  Resp: 16  Temp: 98.3 F (36.8 C)  SpO2: 98%      Lab orders placed from triage:  UPT/UA

## 2023-04-01 NOTE — MAU Provider Note (Cosign Needed)
Event Date/Time   First Provider Initiated Contact with Patient 04/01/23 1348      S Ms. Tina Dickson is a 27 y.o. [phone] patient who presents to MAU today with reports of she believes she had a SAB 2-4 weeks; "I can't remember." The pregnancy was confirmed by U/S at [facility]. She was 6 wks by U/S with an EDC of 09/09/2023. She states she was "supposed to go back to their office last week, but didn't go for personal reasons." She reports she has had vomiting and "slight diarrhea" since 03/28/2023. She reports the diarrhea "only a little bit comes out at a time." She reports about "a month or so ago" she had "heavy vaginal bleeding." She denies VB today. She had spotting last week.   O BP 128/78 (BP Location: Right Arm)   Pulse 82   Temp 98.3 F (36.8 C) (Oral)   Resp 16   Ht 5\' 3"  (1.6 m)   Wt 105.1 kg   LMP  (LMP Unknown) Comment: pt reports recent preg, confirmed at [facility] office, edc 6/8 per [place], believes she had an [place] 2-4 wks ago  SpO2 98%   Breastfeeding Unknown   BMI 41.06 kg/m  Physical Exam Vitals and nursing note reviewed.  Constitutional:      Appearance: Normal appearance. She is obese.  Cardiovascular:     Rate and Rhythm: Normal rate.  Pulmonary:     Effort: Pulmonary effort is normal.  Genitourinary:    Comments: Not indicated Neurological:     Mental Status: She is alert and oriented to person, place, and time.  Psychiatric:        Attention and Perception: Attention and perception normal.        Mood and Affect: Mood normal. Affect is flat.        Speech: Speech normal.        Behavior: Behavior is withdrawn. Behavior is cooperative.        Thought Content: Thought content normal.        Cognition and Memory: Cognition normal.        Judgment: Judgment normal.    A Medical screening exam complete 1. Negative pregnancy test (Primary) - Discharge patient  2. Nausea vomiting and diarrhea    P Discharge from MAU in stable condition Patient  given the option of transfer to [facility] for further evaluation or seek care in outpatient facility of choice  Warning signs for worsening condition that would warrant emergency follow-up discussed Patient may return to MAU as needed for pregnancy related issues TC to [facility] provider, Jonny Ruiz, to report that patient may or may not come to their unit seeking medical care. Patient is not pregnant nor having pregnancy related complaints.  Raelyn Mora, CNM 04/01/2023 1:48 PM

## 2023-09-21 DIAGNOSIS — K912 Postsurgical malabsorption, not elsewhere classified: Secondary | ICD-10-CM | POA: Insufficient documentation

## 2024-01-20 ENCOUNTER — Encounter (HOSPITAL_BASED_OUTPATIENT_CLINIC_OR_DEPARTMENT_OTHER): Payer: Self-pay

## 2024-01-20 ENCOUNTER — Other Ambulatory Visit: Payer: Self-pay

## 2024-01-20 ENCOUNTER — Emergency Department (HOSPITAL_BASED_OUTPATIENT_CLINIC_OR_DEPARTMENT_OTHER)
Admission: EM | Admit: 2024-01-20 | Discharge: 2024-01-20 | Disposition: A | Discharge status: Home / Self Care | Attending: Emergency Medicine | Admitting: Emergency Medicine

## 2024-01-20 DIAGNOSIS — Z3A01 Less than 8 weeks gestation of pregnancy: Secondary | ICD-10-CM

## 2024-01-20 DIAGNOSIS — Z3A08 8 weeks gestation of pregnancy: Secondary | ICD-10-CM | POA: Diagnosis not present

## 2024-01-20 DIAGNOSIS — J45909 Unspecified asthma, uncomplicated: Secondary | ICD-10-CM | POA: Insufficient documentation

## 2024-01-20 DIAGNOSIS — Z91040 Latex allergy status: Secondary | ICD-10-CM | POA: Diagnosis not present

## 2024-01-20 DIAGNOSIS — J069 Acute upper respiratory infection, unspecified: Secondary | ICD-10-CM | POA: Diagnosis not present

## 2024-01-20 DIAGNOSIS — O99511 Diseases of the respiratory system complicating pregnancy, first trimester: Secondary | ICD-10-CM | POA: Diagnosis not present

## 2024-01-20 DIAGNOSIS — O26891 Other specified pregnancy related conditions, first trimester: Secondary | ICD-10-CM | POA: Diagnosis present

## 2024-01-20 LAB — RESP PANEL BY RT-PCR (RSV, FLU A&B, COVID)  RVPGX2
Influenza A by PCR: NEGATIVE
Influenza B by PCR: NEGATIVE
Resp Syncytial Virus by PCR: NEGATIVE
SARS Coronavirus 2 by RT PCR: NEGATIVE

## 2024-01-20 LAB — PREGNANCY, URINE: Preg Test, Ur: POSITIVE — AB

## 2024-01-20 MED ORDER — PRENATAL MULTIVITAMIN CH: 1.0000 | ORAL_TABLET | Freq: Every day | ORAL | 0 refills | Status: AC

## 2024-01-20 MED ORDER — DEXAMETHASONE 4 MG PO TABS
10.0000 mg | ORAL_TABLET | Freq: Once | ORAL | Status: AC
  Administered 2024-01-20: 10 mg via ORAL
  Filled 2024-01-20: qty 3

## 2024-01-20 NOTE — ED Triage Notes (Signed)
 Pt to ED from home with c/o cogestion which began yesterday. Pt endorses a dry cough and feeling unwell. Arrives A+O, VSS, NADN.

## 2024-01-20 NOTE — ED Provider Notes (Signed)
  [place] EMERGENCY DEPARTMENT AT [facility] Provider Note   CSN: [id number] Arrival date & time: 01/20/24  9354     Patient presents with: Nasal Congestion   Tina Dickson is a 28 y.o. female.  {Add pertinent medical, surgical, social history, OB history to HPI:32947} HPI     28 year old female with history of gastric bypass  Feeling bad overall, congested, dry cough, fatigue Felt a little warm last night, didn't have thermometer Taking the dayquil with acetaminophen  No body aches, just fatigue Throat sore last night, tingle now No nausea, vomiting, diarrhea Little bit of wheezing Work at [employer]  Past Medical History:  Diagnosis Date   Abdominal pain, recurrent    characterized as crampy, spasmodic pain   Asthma    as a child- not since 2010   Chlamydia 08/2020   during pregnancy   Chronic constipation    Depression    Eczema      Prior to Admission medications   Medication Sig Start Date End Date Taking? Authorizing Provider  ibuprofen  (ADVIL ) 600 MG tablet Take 1 tablet (600 mg total) by mouth every 6 (six) hours as needed. 09/26/22   Okey Leader, MD  Prenatal Vit-Fe Fumarate-FA (PRENATAL MULTIVITAMIN) TABS tablet Take 1 tablet by mouth daily at 12 noon.    [provider]  cetirizine  (ZYRTEC ) 10 MG tablet Take 1 tablet (10 mg total) by mouth daily for 10 days. Patient not taking: Reported on 01/18/2018 10/26/17 12/02/18  Jason Leita Blush, MD    Allergies: Latex and Penicillins    Review of Systems  Updated Vital Signs BP 135/86 (BP Location: Right Arm)   Pulse 77   Temp 98.8 F (37.1 C) (Oral)   Resp 18   Ht 5' 3 (1.6 m)   Wt 105 kg   SpO2 99%   BMI 41.01 kg/m   Physical Exam  (all labs ordered are listed, but only abnormal results are displayed) Labs Reviewed - No data to display  EKG: None  Radiology: No results found.  {Document cardiac monitor, telemetry assessment procedure when  appropriate:32947} Procedures   Medications Ordered in the ED - No data to display    {Click here for ABCD2, HEART and other calculators REFRESH Note before signing:1}                              Medical Decision Making  ***  {Document critical care time when appropriate  Document review of labs and clinical decision tools ie CHADS2VASC2, etc  Document your independent review of radiology images and any outside records  Document your discussion with family members, caretakers and with consultants  Document social determinants of health affecting pt's care  Document your decision making why or why not admission, treatments were needed:32947:::1}   Final diagnoses:  None    ED Discharge Orders     None

## 2024-01-23 ENCOUNTER — Encounter (HOSPITAL_COMMUNITY): Payer: Self-pay | Admitting: *Deleted

## 2024-01-23 ENCOUNTER — Other Ambulatory Visit: Payer: Self-pay

## 2024-01-23 ENCOUNTER — Inpatient Hospital Stay (HOSPITAL_COMMUNITY)

## 2024-01-23 ENCOUNTER — Inpatient Hospital Stay (HOSPITAL_COMMUNITY)
Admission: AD | Admit: 2024-01-23 | Discharge: 2024-01-23 | Disposition: A | Discharge status: Home / Self Care | Attending: Obstetrics and Gynecology | Admitting: Obstetrics and Gynecology

## 2024-01-23 DIAGNOSIS — O209 Hemorrhage in early pregnancy, unspecified: Secondary | ICD-10-CM | POA: Insufficient documentation

## 2024-01-23 DIAGNOSIS — R7989 Other specified abnormal findings of blood chemistry: Secondary | ICD-10-CM | POA: Diagnosis not present

## 2024-01-23 DIAGNOSIS — B9689 Other specified bacterial agents as the cause of diseases classified elsewhere: Secondary | ICD-10-CM | POA: Insufficient documentation

## 2024-01-23 DIAGNOSIS — R188 Other ascites: Secondary | ICD-10-CM | POA: Diagnosis not present

## 2024-01-23 DIAGNOSIS — O26899 Other specified pregnancy related conditions, unspecified trimester: Secondary | ICD-10-CM | POA: Insufficient documentation

## 2024-01-23 DIAGNOSIS — R9389 Abnormal findings on diagnostic imaging of other specified body structures: Secondary | ICD-10-CM | POA: Diagnosis not present

## 2024-01-23 DIAGNOSIS — Z3A00 Weeks of gestation of pregnancy not specified: Secondary | ICD-10-CM

## 2024-01-23 DIAGNOSIS — Z3687 Encounter for antenatal screening for uncertain dates: Secondary | ICD-10-CM | POA: Diagnosis not present

## 2024-01-23 DIAGNOSIS — O3680X0 Pregnancy with inconclusive fetal viability, not applicable or unspecified: Secondary | ICD-10-CM | POA: Insufficient documentation

## 2024-01-23 DIAGNOSIS — R109 Unspecified abdominal pain: Secondary | ICD-10-CM | POA: Diagnosis not present

## 2024-01-23 LAB — URINALYSIS, ROUTINE W REFLEX MICROSCOPIC
Nitrite: NEGATIVE
Specific Gravity, Urine: 1.025 (ref 1.005–1.030)
pH: 6 (ref 5.0–8.0)

## 2024-01-23 LAB — HCG, QUANTITATIVE, PREGNANCY: hCG, Beta Chain, Quant, S: 3639 m[IU]/mL — ABNORMAL HIGH (ref <5)

## 2024-01-23 LAB — CBC
HCT: 34.8 % — ABNORMAL LOW (ref 36.0–46.0)
Hemoglobin: 11.6 g/dL — ABNORMAL LOW (ref 12.0–15.0)
MCH: 26.5 pg (ref 26.0–34.0)
MCHC: 33.3 g/dL (ref 30.0–36.0)
MCV: 79.5 fL — ABNORMAL LOW (ref 80.0–100.0)
Platelets: 203 K/uL (ref 150–400)
RBC: 4.38 MIL/uL (ref 3.87–5.11)
RDW: 13.1 % (ref 11.5–15.5)
WBC: 6.2 K/uL (ref 4.0–10.5)
nRBC: 0 % (ref 0.0–0.2)

## 2024-01-23 LAB — WET PREP, GENITAL
Sperm: NONE SEEN
Trich, Wet Prep: NONE SEEN
WBC, Wet Prep HPF POC: >=10 — AB (ref <10)
Yeast Wet Prep HPF POC: NONE SEEN

## 2024-01-23 LAB — URINALYSIS, MICROSCOPIC (REFLEX): Bacteria, UA: NONE SEEN

## 2024-01-23 LAB — ABO/RH: ABO/RH(D): A POS

## 2024-01-23 MED ORDER — METRONIDAZOLE 500 MG PO TABS: 500.0000 mg | ORAL_TABLET | Freq: Two times a day (BID) | ORAL | 0 refills | Status: AC

## 2024-01-23 NOTE — MAU Provider Note (Signed)
 Chief Complaint:  Vaginal Bleeding and Abdominal Pain   HPI   None     Tina Dickson is a 28 y.o. [phone] at Unknown who presents to maternity admissions reporting feeling unwell. Went to the ED on Sunday and had a positive pregnancy test. Started having bleeding today that has increased throughout the day. Describes the bleeding as dark red with 3 golf ball-sized clots. Filled up a pad within an hour that has soaked though her clothes and stained her carseat. Denies headache, nausea, vomiting, chest pain, shortness of breath, diarrhea or constipation. Unsure of LMP. Recently underwent bariatric surgery in June.     Pregnancy Course: Has followed with Dr. Delana at [facility] in the past.  Past Medical History:  Diagnosis Date   Abdominal pain, recurrent    characterized as crampy, spasmodic pain   Asthma    as a child- not since 2010   Chlamydia 08/2020   during pregnancy   Chronic constipation    Depression    Eczema    OB History  Gravida Para Term Preterm AB Living  4 3 2 1  0 3  SAB IAB Ectopic Multiple Live Births  0 0 0 0 3    # Outcome Date GA Lbr Len/2nd Weight Sex Type Anes PTL Lv  4 Current           3 Preterm 09/23/22 [redacted]w[redacted]d 10:34 / 00:02 2020 g F Vag-Spont EPI  LIV  2 Term 04/18/21 102w5d 13:22 / 00:11 3430 g F Vag-Spont EPI  LIV  1 Term 12/28/16 [redacted]w[redacted]d 26:40 / 01:04 3280 g F Vag-Spont EPI, Local  LIV     Birth Comments: elevated BP on admission and hypotension after delivery    Obstetric Comments  G1 - reported increased bleeding requiring meds but no transfusion, per patient   Past Surgical History:  Procedure Laterality Date   DILATION AND CURETTAGE OF UTERUS  04/2021   HERNIA REPAIR  1998   Family History  Problem Relation Age of Onset   Lactose intolerance Mother    Asthma Mother    Irritable bowel syndrome Father    Diabetes Father    Asthma Brother    Cancer Maternal Grandmother    Diabetes Paternal Grandfather    Social History    Tobacco Use   Smoking status: Never   Smokeless tobacco: Never  Vaping Use   Vaping status: Never Used  Substance Use Topics   Alcohol use: No   Drug use: No   Allergies  Allergen Reactions   Latex Swelling   Penicillins Other (See Comments)    Gets severe yeast infections with cillins   No medications prior to admission.    I have reviewed patient's Past Medical Hx, Surgical Hx, Family Hx, Social Hx, medications and allergies.   ROS  Pertinent items noted in HPI and remainder of comprehensive ROS otherwise negative.   PHYSICAL EXAM  Patient Vitals for the past 24 hrs:  BP Temp Temp src Pulse Resp SpO2 Height Weight  01/23/24 1909 118/65 98.7 F (37.1 C) Oral 69 19 99 % -- --  01/23/24 1902 -- -- -- -- -- -- 5' 3 (1.6 m) 87.5 kg    Constitutional: Well-developed, well-nourished female in no acute distress.  Cardiovascular: Warm and well-perfused Respiratory: normal effort, no problems with respiration noted GI: Abd soft, non-tender, non-distended MS: Extremities nontender, no edema, normal ROM Neurologic: Alert and oriented x 4.  Pelvic: deferred    Labs: Results for orders  placed or performed during the hospital encounter of 01/23/24 (from the past 24 hours)  Urinalysis, Routine w reflex microscopic -Urine, Clean Catch     Status: Abnormal   Collection Time: 01/23/24  7:12 PM  Result Value Ref Range   Color, Urine BROWN (A) YELLOW   APPearance TURBID (A) CLEAR   Specific Gravity, Urine 1.025 1.005 - 1.030   pH 6.0 5.0 - 8.0   Glucose, UA (A) NEGATIVE mg/dL    TEST NOT REPORTED DUE TO COLOR INTERFERENCE OF URINE PIGMENT   Hgb urine dipstick (A) NEGATIVE    TEST NOT REPORTED DUE TO COLOR INTERFERENCE OF URINE PIGMENT   Bilirubin Urine (A) NEGATIVE    TEST NOT REPORTED DUE TO COLOR INTERFERENCE OF URINE PIGMENT   Ketones, ur (A) NEGATIVE mg/dL    TEST NOT REPORTED DUE TO COLOR INTERFERENCE OF URINE PIGMENT   Protein, ur (A) NEGATIVE mg/dL    TEST NOT  REPORTED DUE TO COLOR INTERFERENCE OF URINE PIGMENT   Nitrite NEGATIVE NEGATIVE   Leukocytes,Ua (A) NEGATIVE    TEST NOT REPORTED DUE TO COLOR INTERFERENCE OF URINE PIGMENT  Urinalysis, Microscopic (reflex)     Status: None   Collection Time: 01/23/24  7:12 PM  Result Value Ref Range   RBC / HPF 6-10 0 - 5 RBC/hpf   WBC, UA 6-10 0 - 5 WBC/hpf   Bacteria, UA NONE SEEN NONE SEEN   Squamous Epithelial / HPF 0-5 0 - 5 /HPF  Wet prep, genital     Status: Abnormal   Collection Time: 01/23/24  7:44 PM  Result Value Ref Range   Yeast Wet Prep HPF POC NONE SEEN NONE SEEN   Trich, Wet Prep NONE SEEN NONE SEEN   Clue Cells Wet Prep HPF POC PRESENT (A) NONE SEEN   WBC, Wet Prep HPF POC >=10 (A) <10   Sperm NONE SEEN   ABO/Rh     Status: None   Collection Time: 01/23/24  7:54 PM  Result Value Ref Range   ABO/RH(D) A POS    No rh immune globuloin      NOT A RH IMMUNE GLOBULIN CANDIDATE, PT RH POSITIVE Performed at [facility], [address], [place], [place] [address]   CBC     Status: Abnormal   Collection Time: 01/23/24  7:58 PM  Result Value Ref Range   WBC 6.2 4.0 - 10.5 K/uL   RBC 4.38 3.87 - 5.11 MIL/uL   Hemoglobin 11.6 (L) 12.0 - 15.0 g/dL   HCT 65.1 (L) 63.9 - 53.9 %   MCV 79.5 (L) 80.0 - 100.0 fL   MCH 26.5 26.0 - 34.0 pg   MCHC 33.3 30.0 - 36.0 g/dL   RDW 86.8 88.4 - 84.4 %   Platelets 203 150 - 400 K/uL   nRBC 0.0 0.0 - 0.2 %  hCG, quantitative, pregnancy     Status: Abnormal   Collection Time: 01/23/24  7:58 PM  Result Value Ref Range   hCG, Beta Chain, Quant, S 3,639 (H) <5 mIU/mL    Imaging:  US  OB LESS THAN 14 WEEKS WITH OB TRANSVAGINAL Result Date: 01/23/2024 EXAM: OBSTETRIC ULTRASOUND FIRST TRIMESTER TECHNIQUE: Both transabdominal and transvaginal ultrasound examinations were performed for complete evaluation of the gestation as well as the maternal uterus, adnexal regions, and pelvic cul-de-sac. Transvaginal technique was performed to assess early  pregnancy. COMPARISON: None provided. CLINICAL HISTORY: Vaginal bleeding in pregnancy, first trimester. FINDINGS: UTERUS: No focal myometrial mass. Thickened  endometrium measuring 17 mm. GESTATIONAL SAC(S): No intrauterine gestational sac. No subchorionic hemorrhage. YOLK SAC: No yolk sac. EMBRYO(<11WK) /FETUS(>=11WK): No embryo. CROWN RUMP LENGTH: Not measured RATE OF CARDIAC ACTIVITY: No cardiac activity. RIGHT OVARY: Unremarkable. LEFT OVARY: Unremarkable. FREE FLUID: Moderate free fluid in the pelvis. IMPRESSION: 1. No visualized intrauterine gestational sac. In the setting of positive pregnancy test, this reflects a pregnancy of unknown location. Differential considerations include early normal IUP, abnormal IUP, or nonvisualized ectopic pregnancy. Differentiation is achieved with serial beta HCG supplemented by repeat sonography as clinically warranted. 2. Moderate free fluid in the pelvis. 3. Thickened endometrium measuring 17 mm. Electronically signed by: Norman Gatlin MD 01/23/2024 08:52 PM EDT RP Workstation: [id number]    MDM & MAU COURSE  MDM: Moderate  MAU Course: Orders Placed This Encounter  Procedures   Wet prep, genital   US  OB LESS THAN 14 WEEKS WITH OB TRANSVAGINAL   Urinalysis, Routine w reflex microscopic -Urine, Clean Catch   CBC   hCG, quantitative, pregnancy   RPR   Urinalysis, Microscopic (reflex)   Diet NPO time specified   ABO/Rh   Discharge patient   Meds ordered this encounter  Medications   metroNIDAZOLE  (FLAGYL ) 500 MG tablet    Sig: Take 1 tablet (500 mg total) by mouth 2 (two) times daily.    Dispense:  14 tablet    Refill:  0   VSS. Labs showing elevated hCG with ultrasound showing no IUP. Wet prep concerning for bacterial vaginosis. Discussed pregnancy of unknown location, plan to repeat hCG Friday evening in the MAU given clinic will be closed at the 48-hour mark. Medication sent to preferred pharmacy. Return precautions discussed in detail. All  questions answered prior to discharge. ASSESSMENT   1. Elevated serum hCG   2. Bacterial vaginosis     PLAN  Discharge home in stable condition with return precautions.  Follow up on Friday, 10/24 in the evening in MAU for repeat hCG.   Follow-up Information     [facility]. Go on 01/25/2024.   Specialty: Obstetrics and Gynecology Why: for repeat bloodwork Contact information: [address] [place]  [phone]                Allergies as of 01/23/2024       Reactions   Latex Swelling   Penicillins Other (See Comments)   Gets severe yeast infections with cillins        Medication List     TAKE these medications    metroNIDAZOLE  500 MG tablet Commonly known as: FLAGYL  Take 1 tablet (500 mg total) by mouth 2 (two) times daily.   prenatal multivitamin Tabs tablet Take 1 tablet by mouth daily at 12 noon.        Charlie Courts, MD  Family Medicine - Obstetrics Fellow

## 2024-01-23 NOTE — MAU Note (Signed)
 Tina Dickson is a 28 y.o. at Unknown here in MAU reporting: she found out she was pregnant on Sunday and began having heavy VB today.  States has been changing pad every hour and passing both small and large clots.  Also reports she's having lower abdominal cramping.  Reports took Tylenol  1000 mg at 1400, some relief noted.  LMP: 9/??02025, unsure of date Onset of complaint: today Pain score: 6 Vitals:   01/23/24 1909  BP: 118/65  Pulse: 69  Resp: 19  Temp: 98.7 F (37.1 C)  SpO2: 99%     FHT: NA  Lab orders placed from triage: UA

## 2024-01-23 NOTE — Discharge Instructions (Addendum)
 You came into the Maternity Assessment Unit because you weren't feeling well. Your pregnancy hormone is elevated but we did not see a pregnancy in the uterus. We will repeat your labwork on Friday evening in the MAU. You can take tylenol  for discomfort, please avoid NSAIDs like Motrin  and ibuprofen  during this time.

## 2024-01-24 LAB — GC/CHLAMYDIA PROBE AMP (~~LOC~~) NOT AT ARMC
Chlamydia: NEGATIVE
Comment: NEGATIVE
Comment: NORMAL
Neisseria Gonorrhea: NEGATIVE

## 2024-01-24 LAB — RPR: RPR Ser Ql: NONREACTIVE
